# Patient Record
Sex: Male | Born: 1970 | Race: White | Hispanic: No | Marital: Married | Smoking: Current every day smoker
Health system: Southern US, Community
[De-identification: ages and names within clinical notes are randomized; demographics above are authoritative.]

## PROBLEM LIST (undated history)

## (undated) DIAGNOSIS — I10 Essential (primary) hypertension: Secondary | ICD-10-CM

## (undated) DIAGNOSIS — J45909 Unspecified asthma, uncomplicated: Secondary | ICD-10-CM

## (undated) DIAGNOSIS — E119 Type 2 diabetes mellitus without complications: Secondary | ICD-10-CM

## (undated) DIAGNOSIS — F17200 Nicotine dependence, unspecified, uncomplicated: Secondary | ICD-10-CM

## (undated) DIAGNOSIS — N6320 Unspecified lump in the left breast, unspecified quadrant: Secondary | ICD-10-CM

## (undated) DIAGNOSIS — R9431 Abnormal electrocardiogram [ECG] [EKG]: Secondary | ICD-10-CM

## (undated) HISTORY — PX: JOINT REPLACEMENT: SHX530

## (undated) HISTORY — DX: Unspecified asthma, uncomplicated: J45.909

## (undated) HISTORY — DX: Nicotine dependence, unspecified, uncomplicated: F17.200

## (undated) HISTORY — DX: Type 2 diabetes mellitus without complications: E11.9

## (undated) HISTORY — DX: Essential (primary) hypertension: I10

## (undated) HISTORY — DX: Unspecified lump in the left breast, unspecified quadrant: N63.20

## (undated) HISTORY — DX: Abnormal electrocardiogram (ECG) (EKG): R94.31

## (undated) HISTORY — PX: NO PAST SURGERIES: SHX2092

---

## 2010-05-09 ENCOUNTER — Emergency Department (HOSPITAL_COMMUNITY): Admission: EM | Admit: 2010-05-09 | Discharge: 2010-05-10 | Payer: Self-pay | Admitting: Emergency Medicine

## 2010-05-10 ENCOUNTER — Ambulatory Visit: Payer: Self-pay | Admitting: Psychiatry

## 2010-05-10 ENCOUNTER — Inpatient Hospital Stay (HOSPITAL_COMMUNITY): Admission: AD | Admit: 2010-05-10 | Discharge: 2010-05-13 | Payer: Self-pay | Admitting: Psychiatry

## 2011-03-04 LAB — LIPASE, BLOOD: Lipase: 34 U/L (ref 11–59)

## 2011-03-04 LAB — COMPREHENSIVE METABOLIC PANEL
ALT: 115 U/L — ABNORMAL HIGH (ref 0–53)
Albumin: 4.3 g/dL (ref 3.5–5.2)
CO2: 29 mEq/L (ref 19–32)
Glucose, Bld: 116 mg/dL — ABNORMAL HIGH (ref 70–99)
Potassium: 3.8 mEq/L (ref 3.5–5.1)
Total Bilirubin: 0.6 mg/dL (ref 0.3–1.2)

## 2011-03-04 LAB — DIFFERENTIAL
Eosinophils Absolute: 0.1 10*3/uL (ref 0.0–0.7)
Lymphocytes Relative: 47 % — ABNORMAL HIGH (ref 12–46)
Lymphs Abs: 4.9 10*3/uL — ABNORMAL HIGH (ref 0.7–4.0)
Monocytes Absolute: 0.9 10*3/uL (ref 0.1–1.0)
Monocytes Relative: 9 % (ref 3–12)
Neutrophils Relative %: 43 % (ref 43–77)

## 2011-03-04 LAB — CBC
HCT: 46.9 % (ref 39.0–52.0)
MCHC: 35 g/dL (ref 30.0–36.0)
MCV: 93.3 fL (ref 78.0–100.0)
RBC: 5.03 MIL/uL (ref 4.22–5.81)
RDW: 12.5 % (ref 11.5–15.5)
WBC: 10.4 10*3/uL (ref 4.0–10.5)

## 2011-03-04 LAB — URINALYSIS, ROUTINE W REFLEX MICROSCOPIC
Bilirubin Urine: NEGATIVE
Glucose, UA: NEGATIVE mg/dL
Hgb urine dipstick: NEGATIVE
Specific Gravity, Urine: 1.006 (ref 1.005–1.030)
Urobilinogen, UA: 0.2 mg/dL (ref 0.0–1.0)
pH: 6 (ref 5.0–8.0)

## 2011-03-04 LAB — URINE MICROSCOPIC-ADD ON

## 2011-03-04 LAB — HEPATIC FUNCTION PANEL
Albumin: 4.2 g/dL (ref 3.5–5.2)
Alkaline Phosphatase: 89 U/L (ref 39–117)
Bilirubin, Direct: 0.1 mg/dL (ref 0.0–0.3)
Indirect Bilirubin: 1 mg/dL — ABNORMAL HIGH (ref 0.3–0.9)
Total Protein: 7.4 g/dL (ref 6.0–8.3)

## 2011-03-04 LAB — ETHANOL
Alcohol, Ethyl (B): 315 mg/dL — ABNORMAL HIGH (ref 0–10)
Alcohol, Ethyl (B): 97 mg/dL — ABNORMAL HIGH (ref 0–10)

## 2011-03-04 LAB — RAPID URINE DRUG SCREEN, HOSP PERFORMED
Amphetamines: NOT DETECTED
Benzodiazepines: NOT DETECTED

## 2011-03-04 LAB — RPR: RPR Ser Ql: NONREACTIVE

## 2011-05-01 ENCOUNTER — Other Ambulatory Visit: Payer: Self-pay | Admitting: Family Medicine

## 2011-05-01 DIAGNOSIS — N63 Unspecified lump in unspecified breast: Secondary | ICD-10-CM

## 2011-05-10 ENCOUNTER — Ambulatory Visit
Admission: RE | Admit: 2011-05-10 | Discharge: 2011-05-10 | Disposition: A | Discharge status: Home / Self Care | Payer: 59 | Source: Ambulatory Visit | Attending: Family Medicine | Admitting: Family Medicine

## 2011-05-10 DIAGNOSIS — N63 Unspecified lump in unspecified breast: Secondary | ICD-10-CM

## 2011-05-16 ENCOUNTER — Encounter: Payer: Self-pay | Admitting: Cardiology

## 2011-05-17 ENCOUNTER — Encounter: Payer: Self-pay | Admitting: Cardiology

## 2011-05-17 ENCOUNTER — Ambulatory Visit (INDEPENDENT_AMBULATORY_CARE_PROVIDER_SITE_OTHER): Payer: 59 | Admitting: Cardiology

## 2011-05-17 VITALS — BP 141/84 | HR 95 | Resp 14 | Ht 67.0 in | Wt 222.0 lb

## 2011-05-17 DIAGNOSIS — I10 Essential (primary) hypertension: Secondary | ICD-10-CM

## 2011-05-17 DIAGNOSIS — Z720 Tobacco use: Secondary | ICD-10-CM

## 2011-05-17 DIAGNOSIS — E785 Hyperlipidemia, unspecified: Secondary | ICD-10-CM

## 2011-05-17 DIAGNOSIS — F17200 Nicotine dependence, unspecified, uncomplicated: Secondary | ICD-10-CM

## 2011-05-17 DIAGNOSIS — E1169 Type 2 diabetes mellitus with other specified complication: Secondary | ICD-10-CM | POA: Insufficient documentation

## 2011-05-17 DIAGNOSIS — R9431 Abnormal electrocardiogram [ECG] [EKG]: Secondary | ICD-10-CM

## 2011-05-17 MED ORDER — ASPIRIN EC 81 MG PO TBEC: 81.0000 mg | DELAYED_RELEASE_TABLET | Freq: Every day | ORAL | Status: AC

## 2011-05-17 NOTE — Patient Instructions (Addendum)
Your physician has requested that you have a stress echocardiogram. For further information please visit [email or web address] Please follow instruction sheet as given.  Your physician has recommended you make the following change in your medication: Start taking an Aspirin 81 mg daily  Your physician discussed the hazards of tobacco use. Tobacco use cessation is recommended and techniques and options to help you quit were discussed.  Your physician encouraged you to lose weight for better health.  Reducing your salt (sodium) intake can help with keeping your blood pressure under control along with medication.

## 2011-05-17 NOTE — Progress Notes (Signed)
HPI [name] is a 40 year old married white male, father of 3, who is referred today by Dr. Christell Constant for the evaluation of an abnormal EKG.  About 3 weeks ago, a bad coughing spell and passed out. He did not seek medical attention until the next day. At that time he was found to have a blood pressure over 200 systolic and 100 diastolic. EKG showed anterolateral T-wave inversion in borderline LVH.  He was started on antihypertensive therapy. In addition he was started on a statin. I do not have any of his numbers.  He smokes about a pack and half of cigarettes a day. 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 He's overweight. He's never done illicit drugs other than marijuana.  He denies exertional chest tightness or angina. He does have a lot of dyspnea but he smokes quite a bit. Inhalers seem to help. He is not on aspirin.  EKG from Dr. Kathi Der office was reviewed. I confirmed he has anterolateral ST segment changes and borderline LVH. I did not repeat his EKG today. Past Medical History  Diagnosis Date  . HTN (hypertension)   . Smoker   . Left breast mass   . Asthmatic bronchitis   . Abnormal EKG     No past surgical history on file.  No family history on file.  History   Social History  . Marital Status: Married    Spouse Name: N/A    Number of Children: N/A  . Years of Education: N/A   Occupational History  . Not on file.   Social History Main Topics  . Smoking status: Current Everyday Smoker  . Smokeless tobacco: Not on file  . Alcohol Use: Not on file  . Drug Use: Not on file  . Sexually Active: Not on file   Other Topics Concern  . Not on file   Social History Narrative  . No narrative on file    Allergies  Allergen Reactions  . Penicillins     Current Outpatient Prescriptions  Medication Sig Dispense Refill  . albuterol (PROVENTIL) 90 MCG/ACT inhaler Inhale 2 puffs into the lungs every 6 (six) hours as needed.        .  Fluticasone-Salmeterol (ADVAIR DISKUS) 250-50 MCG/DOSE AEPB Inhale 1 puff into the lungs every 12 (twelve) hours.        Marland Kitchen LIPITOR 20 MG tablet Take 20 mg by mouth daily.       . Olmesartan-Amlodipine-HCTZ (TRIBENZOR) 40-10-25 MG TABS Take 1 tablet by mouth daily.        Marland Kitchen aspirin EC 81 MG tablet Take 1 tablet (81 mg total) by mouth daily.  150 tablet  2  . DISCONTD: olmesartan-hydrochlorothiazide (BENICAR HCT) 40-25 MG per tablet Take 1 tablet by mouth daily.          ROS Negative other than HPI.   PE General Appearance: well developed, well nourished in no acute distress, obese, ruddy complexion HEENT: symmetrical face, PERRLA, good dentition  Neck: no JVD, thyromegaly, or adenopathy, trachea midline Chest: symmetric without deformity Cardiac: PMI non-displaced, RRR, normal S1, S2, no gallop or murmur Lung: clear to ausculation and percussion Vascular: all pulses full without bruits  Abdominal: nondistended, nontender, good bowel sounds, no HSM, no bruits Extremities: no cyanosis, clubbing or edema, no sign of DVT, no varicosities  Skin: normal color, no rashes Neuro: alert and oriented x 3, non-focal Pysch: normal affect Filed Vitals:   05/17/11 1033  BP: 141/84  Pulse: 95  Resp: 14  Height: 5\' 7"  (1.702 m)  Weight: 222 lb (100.699 kg)    EKG  Labs and Studies Reviewed.   Lab Results  Component Value Date   WBC 10.4 05/09/2010   HGB 16.4 05/09/2010   HCT 46.9 05/09/2010   MCV 93.3 05/09/2010   PLT 271 05/09/2010      Chemistry      Component Value Date/Time   NA 139 05/09/2010 2216   K 3.8 05/09/2010 2216   CL 101 05/09/2010 2216   CO2 29 05/09/2010 2216   BUN 12 05/09/2010 2216   CREATININE 0.95 05/09/2010 2216      Component Value Date/Time   CALCIUM 8.9 05/09/2010 2216   ALKPHOS 89 05/11/2010 2141   AST 61* 05/11/2010 2141   ALT 95* 05/11/2010 2141   BILITOT 1.1 05/11/2010 2141       No results found for this basename: CHOL   No results found for this  basename: HDL   No results found for this basename: LDLCALC   No results found for this basename: TRIG   No results found for this basename: CHOLHDL   No results found for this basename: HGBA1C   Lab Results  Component Value Date   ALT 95* 05/11/2010   AST 61* 05/11/2010   ALKPHOS 89 05/11/2010   BILITOT 1.1 05/11/2010   No results found for this basename: TSH

## 2011-06-07 ENCOUNTER — Ambulatory Visit (HOSPITAL_COMMUNITY): Payer: PRIVATE HEALTH INSURANCE | Attending: Cardiology | Admitting: Radiology

## 2011-06-07 ENCOUNTER — Ambulatory Visit (HOSPITAL_BASED_OUTPATIENT_CLINIC_OR_DEPARTMENT_OTHER): Payer: PRIVATE HEALTH INSURANCE | Admitting: Radiology

## 2011-06-07 DIAGNOSIS — R9431 Abnormal electrocardiogram [ECG] [EKG]: Secondary | ICD-10-CM

## 2011-06-07 DIAGNOSIS — F17200 Nicotine dependence, unspecified, uncomplicated: Secondary | ICD-10-CM | POA: Insufficient documentation

## 2011-06-07 DIAGNOSIS — R072 Precordial pain: Secondary | ICD-10-CM

## 2011-06-07 DIAGNOSIS — E785 Hyperlipidemia, unspecified: Secondary | ICD-10-CM | POA: Insufficient documentation

## 2011-06-07 DIAGNOSIS — R0989 Other specified symptoms and signs involving the circulatory and respiratory systems: Secondary | ICD-10-CM

## 2013-06-04 ENCOUNTER — Other Ambulatory Visit: Payer: Self-pay

## 2013-06-04 MED ORDER — AMLODIPINE-OLMESARTAN 10-40 MG PO TABS: 1.0000 | ORAL_TABLET | Freq: Every day | ORAL | Status: DC

## 2013-07-29 ENCOUNTER — Telehealth: Payer: Self-pay | Admitting: Family Medicine

## 2013-07-30 MED ORDER — ATORVASTATIN CALCIUM 20 MG PO TABS: 20.0000 mg | ORAL_TABLET | Freq: Every day | ORAL | Status: DC

## 2013-07-30 MED ORDER — HYDROCHLOROTHIAZIDE 25 MG PO TABS: 25.0000 mg | ORAL_TABLET | Freq: Every day | ORAL | Status: DC

## 2013-07-30 MED ORDER — AMLODIPINE-OLMESARTAN 10-40 MG PO TABS: 1.0000 | ORAL_TABLET | Freq: Every day | ORAL | Status: DC

## 2013-07-30 NOTE — Telephone Encounter (Signed)
done

## 2013-08-03 ENCOUNTER — Other Ambulatory Visit: Payer: Self-pay | Admitting: Family Medicine

## 2013-08-03 ENCOUNTER — Telehealth: Payer: Self-pay | Admitting: *Deleted

## 2013-08-03 DIAGNOSIS — I10 Essential (primary) hypertension: Secondary | ICD-10-CM

## 2013-08-03 MED ORDER — LOSARTAN POTASSIUM 100 MG PO TABS: 100.0000 mg | ORAL_TABLET | Freq: Every day | ORAL | Status: DC

## 2013-08-03 MED ORDER — AMLODIPINE BESYLATE 10 MG PO TABS: 10.0000 mg | ORAL_TABLET | Freq: Every day | ORAL | Status: DC

## 2013-08-03 NOTE — Telephone Encounter (Signed)
Stop Azor and start amlodipine and losartan.  Meds sent to pharmacy

## 2013-08-03 NOTE — Telephone Encounter (Signed)
Ins co will not pay for David Bartlett's azor unless he tries amlodipine plus losartan,eprosartan,ibaersartan or benicar, diovan or telmisaetan.  Can yoy change this since I have nothing he has tried in the past.  He has not been seen here since Feb 2014 and has an appt with you in August 29. Thanks

## 2013-08-04 NOTE — Telephone Encounter (Signed)
Left message for pt the following recommendations below and asked to call if questions or problems

## 2013-08-13 ENCOUNTER — Encounter: Payer: Self-pay | Admitting: Family Medicine

## 2013-08-13 ENCOUNTER — Ambulatory Visit (INDEPENDENT_AMBULATORY_CARE_PROVIDER_SITE_OTHER): Payer: 59 | Admitting: Family Medicine

## 2013-08-13 VITALS — BP 131/79 | HR 87 | Temp 97.7°F | Ht 67.0 in | Wt 213.0 lb

## 2013-08-13 DIAGNOSIS — I10 Essential (primary) hypertension: Secondary | ICD-10-CM

## 2013-08-13 DIAGNOSIS — Z0000 Encounter for general adult medical examination without abnormal findings: Secondary | ICD-10-CM

## 2013-08-13 LAB — POCT CBC
Granulocyte percent: 53.7 %G (ref 37–80)
HCT, POC: 41.9 % — AB (ref 43.5–53.7)
Hemoglobin: 14.3 g/dL (ref 14.1–18.1)
Lymph, poc: 3.4 (ref 0.6–3.4)
MCH, POC: 30.9 pg (ref 27–31.2)
MCHC: 34.1 g/dL (ref 31.8–35.4)
MCV: 90.7 fL (ref 80–97)
MPV: 7.3 fL (ref 0–99.8)
POC Granulocyte: 4.5 (ref 2–6.9)
POC LYMPH PERCENT: 40.9 %L (ref 10–50)
Platelet Count, POC: 172 10*3/uL (ref 142–424)
RBC: 4.6 M/uL — AB (ref 4.69–6.13)
RDW, POC: 12 %
WBC: 8.4 10*3/uL (ref 4.6–10.2)

## 2013-08-13 NOTE — Patient Instructions (Addendum)
Smoking Cessation Quitting smoking is important to your health and has many advantages. However, it is not always easy to quit since nicotine is a very addictive drug. Often times, people try 3 times or more before being able to quit. This document explains the best ways for you to prepare to quit smoking. Quitting takes hard work and a lot of effort, but you can do it. ADVANTAGES OF QUITTING SMOKING  You will live longer, feel better, and live better.  Your body will feel the impact of quitting smoking almost immediately.  Within 20 minutes, blood pressure decreases. Your pulse returns to its normal level.  After 8 hours, carbon monoxide levels in the blood return to normal. Your oxygen level increases.  After 24 hours, the chance of having a heart attack starts to decrease. Your breath, hair, and body stop smelling like smoke.  After 48 hours, damaged nerve endings begin to recover. Your sense of taste and smell improve.  After 72 hours, the body is virtually free of nicotine. Your bronchial tubes relax and breathing becomes easier.  After 2 to 12 weeks, lungs can hold more air. Exercise becomes easier and circulation improves.  The risk of having a heart attack, stroke, cancer, or lung disease is greatly reduced.  After 1 year, the risk of coronary heart disease is cut in half.  After 5 years, the risk of stroke falls to the same as a nonsmoker.  After 10 years, the risk of lung cancer is cut in half and the risk of other cancers decreases significantly.  After 15 years, the risk of coronary heart disease drops, usually to the level of a nonsmoker.  If you are pregnant, quitting smoking will improve your chances of having a healthy baby.  The people you live with, especially any children, will be healthier.  You will have extra money to spend on things other than cigarettes. QUESTIONS TO THINK ABOUT BEFORE ATTEMPTING TO QUIT You may want to talk about your answers with your  caregiver.  Why do you want to quit?  If you tried to quit in the past, what helped and what did not?  What will be the most difficult situations for you after you quit? How will you plan to handle them?  Who can help you through the tough times? Your family? Friends? A caregiver?  What pleasures do you get from smoking? What ways can you still get pleasure if you quit? Here are some questions to ask your caregiver:  How can you help me to be successful at quitting?  What medicine do you think would be best for me and how should I take it?  What should I do if I need more help?  What is smoking withdrawal like? How can I get information on withdrawal? GET READY  Set a quit date.  Change your environment by getting rid of all cigarettes, ashtrays, matches, and lighters in your home, car, or work. Do not let people smoke in your home.  Review your past attempts to quit. Think about what worked and what did not. GET SUPPORT AND ENCOURAGEMENT You have a better chance of being successful if you have help. You can get support in many ways.  Tell your family, friends, and co-workers that you are going to quit and need their support. Ask them not to smoke around you.  Get individual, group, or telephone counseling and support. Programs are available at local hospitals and health centers. Call your local health department for   information about programs in your area.  Spiritual beliefs and practices may help some smokers quit.  Download a "quit meter" on your computer to keep track of quit statistics, such as how long you have gone without smoking, cigarettes not smoked, and money saved.  Get a self-help book about quitting smoking and staying off of tobacco. LEARN NEW SKILLS AND BEHAVIORS  Distract yourself from urges to smoke. Talk to someone, go for a walk, or occupy your time with a task.  Change your normal routine. Take a different route to work. Drink tea instead of coffee.  Eat breakfast in a different place.  Reduce your stress. Take a hot bath, exercise, or read a book.  Plan something enjoyable to do every day. Reward yourself for not smoking.  Explore interactive web-based programs that specialize in helping you quit. GET MEDICINE AND USE IT CORRECTLY Medicines can help you stop smoking and decrease the urge to smoke. Combining medicine with the above behavioral methods and support can greatly increase your chances of successfully quitting smoking.  Nicotine replacement therapy helps deliver nicotine to your body without the negative effects and risks of smoking. Nicotine replacement therapy includes nicotine gum, lozenges, inhalers, nasal sprays, and skin patches. Some may be available over-the-counter and others require a prescription.  Antidepressant medicine helps people abstain from smoking, but how this works is unknown. This medicine is available by prescription.  Nicotinic receptor partial agonist medicine simulates the effect of nicotine in your brain. This medicine is available by prescription. Ask your caregiver for advice about which medicines to use and how to use them based on your health history. Your caregiver will tell you what side effects to look out for if you choose to be on a medicine or therapy. Carefully read the information on the package. Do not use any other product containing nicotine while using a nicotine replacement product.  RELAPSE OR DIFFICULT SITUATIONS Most relapses occur within the first 3 months after quitting. Do not be discouraged if you start smoking again. Remember, most people try several times before finally quitting. You may have symptoms of withdrawal because your body is used to nicotine. You may crave cigarettes, be irritable, feel very hungry, cough often, get headaches, or have difficulty concentrating. The withdrawal symptoms are only temporary. They are strongest when you first quit, but they will go away within  10 14 days. To reduce the chances of relapse, try to:  Avoid drinking alcohol. Drinking lowers your chances of successfully quitting.  Reduce the amount of caffeine you consume. Once you quit smoking, the amount of caffeine in your body increases and can give you symptoms, such as a rapid heartbeat, sweating, and anxiety.  Avoid smokers because they can make you want to smoke.  Do not let weight gain distract you. Many smokers will gain weight when they quit, usually less than 10 pounds. Eat a healthy diet and stay active. You can always lose the weight gained after you quit.  Find ways to improve your mood other than smoking. FOR MORE INFORMATION  www.smokefree.gov  Document Released: 11/26/2001 Document Revised: 06/02/2012 Document Reviewed: 03/12/2012 [employer] Patient Information 2014 [employer].  

## 2013-08-13 NOTE — Progress Notes (Signed)
  Subjective:    Patient ID: David Bartlett, male    DOB: 1971-09-13, 42 y.o.   MRN: [id number]  HPI This 42 y.o. male presents for evaluation of annual follow up.  He has  Hx of hypertension.  He has been under a lot of stress recently and  His wife is sick and has had to be hospitalized and he has found out  That his company is going to drop his insurance.   Review of Systems    No chest pain, SOB, HA, dizziness, vision change, N/V, diarrhea, constipation, dysuria, urinary urgency or frequency, myalgias, arthralgias or rash.  Objective:   Physical Exam  Vital signs noted  Well developed well nourished male.  HEENT - Head atraumatic Normocephalic                Eyes - PERRLA, Conjuctiva - clear Sclera- Clear EOMI                Ears - EAC's Wnl TM's Wnl Gross Hearing WNL                Nose - Nares patent                 Throat - oropharanx wnl Respiratory - Lungs with expiratory wheezes. Cardiac - RRR S1 and S2 without murmur GI - Abdomen soft Nontender and bowel sounds active x 4 Extremities - No edema. Neuro - Grossly intact.      Assessment & Plan:  Essential hypertension, benign - Plan: POCT CBC, CMP14+EGFR.  Controlled and continue current regimen.  Routine general medical examination at a health care facility - Plan: POCT CBC, CMP14+EGFR, Lipid panel, Thyroid Panel With TSH, PSA, total and free.  Tobacco abuse - Encouraged patient to quit smoking.

## 2013-08-14 LAB — CMP14+EGFR
ALT: 147 IU/L — ABNORMAL HIGH (ref 0–44)
AST: 127 IU/L — ABNORMAL HIGH (ref 0–40)
Albumin/Globulin Ratio: 1.5 (ref 1.1–2.5)
Albumin: 4.4 g/dL (ref 3.5–5.5)
Alkaline Phosphatase: 105 IU/L (ref 39–117)
BUN/Creatinine Ratio: 14 (ref 9–20)
BUN: 14 mg/dL (ref 6–24)
CO2: 23 mmol/L (ref 18–29)
Calcium: 9.8 mg/dL (ref 8.7–10.2)
Chloride: 95 mmol/L — ABNORMAL LOW (ref 97–108)
Creatinine, Ser: 0.97 mg/dL (ref 0.76–1.27)
GFR calc Af Amer: 110 mL/min/{1.73_m2} (ref >59)
GFR calc non Af Amer: 95 mL/min/{1.73_m2} (ref >59)
Globulin, Total: 3 g/dL (ref 1.5–4.5)
Glucose: 108 mg/dL — ABNORMAL HIGH (ref 65–99)
Potassium: 4.1 mmol/L (ref 3.5–5.2)
Sodium: 136 mmol/L (ref 134–144)
Total Bilirubin: 0.4 mg/dL (ref 0.0–1.2)
Total Protein: 7.4 g/dL (ref 6.0–8.5)

## 2013-08-14 LAB — THYROID PANEL WITH TSH
Free Thyroxine Index: 1.5 (ref 1.2–4.9)
T3 Uptake Ratio: 27 % (ref 24–39)
T4, Total: 5.7 ug/dL (ref 4.5–12.0)
TSH: 1.7 u[IU]/mL (ref 0.450–4.500)

## 2013-08-14 LAB — LIPID PANEL
Chol/HDL Ratio: 5.7 ratio units — ABNORMAL HIGH (ref 0.0–5.0)
Cholesterol, Total: 131 mg/dL (ref 100–199)
HDL: 23 mg/dL — ABNORMAL LOW (ref >39)
Triglycerides: 509 mg/dL — ABNORMAL HIGH (ref 0–149)

## 2013-08-14 LAB — PSA, TOTAL AND FREE
PSA, Free Pct: 65 %
PSA, Free: 0.13 ng/mL
PSA: 0.2 ng/mL (ref 0.0–4.0)

## 2013-08-18 ENCOUNTER — Other Ambulatory Visit: Payer: Self-pay | Admitting: Family Medicine

## 2013-08-18 DIAGNOSIS — E781 Pure hyperglyceridemia: Secondary | ICD-10-CM

## 2013-10-19 ENCOUNTER — Other Ambulatory Visit: Payer: Self-pay

## 2013-10-19 MED ORDER — ALBUTEROL SULFATE HFA 108 (90 BASE) MCG/ACT IN AERS: 2.0000 | INHALATION_SPRAY | Freq: Four times a day (QID) | RESPIRATORY_TRACT | Status: DC | PRN

## 2013-10-19 MED ORDER — HYDROCHLOROTHIAZIDE 25 MG PO TABS: 25.0000 mg | ORAL_TABLET | Freq: Every day | ORAL | Status: DC

## 2014-02-24 ENCOUNTER — Other Ambulatory Visit: Payer: Self-pay | Admitting: Family Medicine

## 2014-02-24 DIAGNOSIS — I10 Essential (primary) hypertension: Secondary | ICD-10-CM

## 2014-02-24 DIAGNOSIS — E785 Hyperlipidemia, unspecified: Secondary | ICD-10-CM

## 2014-02-24 DIAGNOSIS — J45909 Unspecified asthma, uncomplicated: Secondary | ICD-10-CM

## 2014-02-24 MED ORDER — ALBUTEROL SULFATE HFA 108 (90 BASE) MCG/ACT IN AERS: 2.0000 | INHALATION_SPRAY | Freq: Four times a day (QID) | RESPIRATORY_TRACT | Status: DC | PRN

## 2014-02-24 MED ORDER — MONTELUKAST SODIUM 10 MG PO TABS
10.0000 mg | ORAL_TABLET | Freq: Every day | ORAL | Status: DC
Start: 2014-02-24 — End: 2014-10-28

## 2014-02-24 MED ORDER — LOSARTAN POTASSIUM 100 MG PO TABS: 100.0000 mg | ORAL_TABLET | Freq: Every day | ORAL | Status: DC

## 2014-02-24 MED ORDER — AMLODIPINE BESYLATE 10 MG PO TABS: 10.0000 mg | ORAL_TABLET | Freq: Every day | ORAL | Status: DC

## 2014-02-24 MED ORDER — ATORVASTATIN CALCIUM 20 MG PO TABS: 20.0000 mg | ORAL_TABLET | Freq: Every day | ORAL | Status: DC

## 2014-03-03 ENCOUNTER — Other Ambulatory Visit: Payer: Self-pay | Admitting: Family Medicine

## 2014-05-10 ENCOUNTER — Other Ambulatory Visit: Payer: Self-pay | Admitting: Family Medicine

## 2014-09-12 ENCOUNTER — Other Ambulatory Visit: Payer: Self-pay | Admitting: Family Medicine

## 2014-10-28 ENCOUNTER — Ambulatory Visit (INDEPENDENT_AMBULATORY_CARE_PROVIDER_SITE_OTHER): Payer: 59 | Admitting: Family Medicine

## 2014-10-28 ENCOUNTER — Encounter (INDEPENDENT_AMBULATORY_CARE_PROVIDER_SITE_OTHER): Payer: Self-pay

## 2014-10-28 ENCOUNTER — Encounter: Payer: Self-pay | Admitting: Family Medicine

## 2014-10-28 VITALS — BP 137/78 | HR 77 | Temp 97.7°F | Ht 67.0 in | Wt 228.2 lb

## 2014-10-28 DIAGNOSIS — Z9109 Other allergy status, other than to drugs and biological substances: Secondary | ICD-10-CM

## 2014-10-28 DIAGNOSIS — E785 Hyperlipidemia, unspecified: Secondary | ICD-10-CM

## 2014-10-28 DIAGNOSIS — Z91048 Other nonmedicinal substance allergy status: Secondary | ICD-10-CM

## 2014-10-28 DIAGNOSIS — I10 Essential (primary) hypertension: Secondary | ICD-10-CM

## 2014-10-28 DIAGNOSIS — Z139 Encounter for screening, unspecified: Secondary | ICD-10-CM

## 2014-10-28 LAB — POCT CBC
Granulocyte percent: 46.4 %G (ref 37–80)
HCT, POC: 44 % (ref 43.5–53.7)
Hemoglobin: 14 g/dL — AB (ref 14.1–18.1)
Lymph, poc: 4.1 — AB (ref 0.6–3.4)
MCH, POC: 30.7 pg (ref 27–31.2)
MCHC: 33.6 g/dL (ref 31.8–35.4)
MCV: 91.3 fL (ref 80–97)
MPV: 7 fL (ref 0–99.8)
POC Granulocyte: 4.1 (ref 2–6.9)
POC LYMPH PERCENT: 46.7 %L (ref 10–50)
Platelet Count, POC: 19 10*3/uL — AB (ref 142–424)
RBC: 4.8 M/uL (ref 4.69–6.13)
RDW, POC: 12.1 %
WBC: 8.8 10*3/uL (ref 4.6–10.2)

## 2014-10-28 MED ORDER — ALBUTEROL SULFATE HFA 108 (90 BASE) MCG/ACT IN AERS: 2.0000 | INHALATION_SPRAY | Freq: Four times a day (QID) | RESPIRATORY_TRACT | Status: DC | PRN

## 2014-10-28 MED ORDER — MONTELUKAST SODIUM 10 MG PO TABS: 10.0000 mg | ORAL_TABLET | Freq: Every day | ORAL | Status: DC

## 2014-10-28 MED ORDER — AMLODIPINE BESYLATE 10 MG PO TABS: 10.0000 mg | ORAL_TABLET | Freq: Every day | ORAL | Status: DC

## 2014-10-28 MED ORDER — LOSARTAN POTASSIUM 100 MG PO TABS: 100.0000 mg | ORAL_TABLET | Freq: Every day | ORAL | Status: DC

## 2014-10-28 MED ORDER — ATORVASTATIN CALCIUM 20 MG PO TABS: 20.0000 mg | ORAL_TABLET | Freq: Every day | ORAL | Status: DC

## 2014-10-28 NOTE — Progress Notes (Signed)
   Subjective:    Patient ID: David Bartlett, male    DOB: February 01, 1971, 43 y.o.   MRN: [id number]  HPI Patient is here for routine follow up.    Review of Systems  Constitutional: Negative for fever.  HENT: Negative for ear pain.   Eyes: Negative for discharge.  Respiratory: Negative for cough.   Cardiovascular: Negative for chest pain.  Gastrointestinal: Negative for abdominal distention.  Endocrine: Negative for polyuria.  Genitourinary: Negative for difficulty urinating.  Musculoskeletal: Negative for gait problem and neck pain.  Skin: Negative for color change and rash.  Neurological: Negative for speech difficulty and headaches.  Psychiatric/Behavioral: Negative for agitation.       Objective:    BP 137/78 mmHg  Pulse 77  Temp(Src) 97.7 F (36.5 C) (Oral)  Ht $R'5\' 7"'Ua$  (1.702 m)  Wt 228 lb 3.2 oz (103.511 kg)  BMI 35.73 kg/m2 Physical Exam  Constitutional: He is oriented to person, place, and time. He appears well-developed and well-nourished.  HENT:  Head: Normocephalic and atraumatic.  Mouth/Throat: Oropharynx is clear and moist.  Eyes: Pupils are equal, round, and reactive to light.  Neck: Normal range of motion. Neck supple.  Cardiovascular: Normal rate and regular rhythm.   No murmur heard. Pulmonary/Chest: Effort normal and breath sounds normal.  Abdominal: Soft. Bowel sounds are normal. There is no tenderness.  Neurological: He is alert and oriented to person, place, and time.  Skin: Skin is warm and dry.  Psychiatric: He has a normal mood and affect.          Assessment & Plan:     ICD-9-CM ICD-10-CM   1. Screening V82.9 Z13.9 POCT CBC     Thyroid Panel With TSH     PSA, total and free  2. Hyperlipemia 272.4 E78.5 Lipid panel  3. Essential hypertension, benign 401.1 I10 POCT CBC     CMP14+EGFR     losartan (COZAAR) 100 MG tablet     amLODipine (NORVASC) 10 MG tablet  4. Essential hypertension 401.9 I10 atorvastatin (LIPITOR) 20 MG tablet  5.  Environmental allergies V15.09 Z91.048 montelukast (SINGULAIR) 10 MG tablet     albuterol (PROVENTIL HFA;VENTOLIN HFA) 108 (90 BASE) MCG/ACT inhaler     No Follow-up on file.  Lysbeth Penner FNP

## 2014-10-29 LAB — LIPID PANEL
Chol/HDL Ratio: 4.1 ratio units (ref 0.0–5.0)
Cholesterol, Total: 119 mg/dL (ref 100–199)
HDL: 29 mg/dL — ABNORMAL LOW (ref >39)
LDL Calculated: 27 mg/dL (ref 0–99)
Triglycerides: 317 mg/dL — ABNORMAL HIGH (ref 0–149)
VLDL Cholesterol Cal: 63 mg/dL — ABNORMAL HIGH (ref 5–40)

## 2014-10-29 LAB — CMP14+EGFR
ALT: 96 IU/L — ABNORMAL HIGH (ref 0–44)
AST: 56 IU/L — ABNORMAL HIGH (ref 0–40)
Albumin/Globulin Ratio: 1.7 (ref 1.1–2.5)
Albumin: 4.8 g/dL (ref 3.5–5.5)
Alkaline Phosphatase: 88 IU/L (ref 39–117)
BUN/Creatinine Ratio: 13 (ref 9–20)
BUN: 11 mg/dL (ref 6–24)
CO2: 26 mmol/L (ref 18–29)
Calcium: 9.8 mg/dL (ref 8.7–10.2)
Chloride: 95 mmol/L — ABNORMAL LOW (ref 97–108)
Creatinine, Ser: 0.82 mg/dL (ref 0.76–1.27)
GFR calc Af Amer: 124 mL/min/{1.73_m2} (ref >59)
GFR calc non Af Amer: 108 mL/min/{1.73_m2} (ref >59)
Globulin, Total: 2.8 g/dL (ref 1.5–4.5)
Glucose: 119 mg/dL — ABNORMAL HIGH (ref 65–99)
Potassium: 3.8 mmol/L (ref 3.5–5.2)
Sodium: 139 mmol/L (ref 134–144)
Total Bilirubin: 0.5 mg/dL (ref 0.0–1.2)
Total Protein: 7.6 g/dL (ref 6.0–8.5)

## 2014-10-29 LAB — THYROID PANEL WITH TSH
Free Thyroxine Index: 2 (ref 1.2–4.9)
T3 Uptake Ratio: 26 % (ref 24–39)
T4, Total: 7.6 ug/dL (ref 4.5–12.0)
TSH: 1.63 u[IU]/mL (ref 0.450–4.500)

## 2014-10-29 LAB — PSA, TOTAL AND FREE
PSA, Free Pct: 60 %
PSA, Free: 0.12 ng/mL
PSA: 0.2 ng/mL (ref 0.0–4.0)

## 2014-10-31 ENCOUNTER — Other Ambulatory Visit: Payer: Self-pay | Admitting: Family Medicine

## 2014-11-28 ENCOUNTER — Encounter: Payer: Self-pay | Admitting: Cardiology

## 2015-03-10 ENCOUNTER — Other Ambulatory Visit: Payer: Self-pay | Admitting: Family Medicine

## 2015-04-14 ENCOUNTER — Other Ambulatory Visit: Payer: Self-pay | Admitting: Family Medicine

## 2015-05-08 ENCOUNTER — Other Ambulatory Visit: Payer: Self-pay | Admitting: Family Medicine

## 2015-06-06 ENCOUNTER — Other Ambulatory Visit: Payer: Self-pay | Admitting: Family Medicine

## 2015-06-27 ENCOUNTER — Other Ambulatory Visit: Payer: Self-pay | Admitting: Family Medicine

## 2015-07-14 ENCOUNTER — Encounter: Payer: Self-pay | Admitting: Family Medicine

## 2015-07-14 ENCOUNTER — Ambulatory Visit (INDEPENDENT_AMBULATORY_CARE_PROVIDER_SITE_OTHER): Payer: 59 | Admitting: Family Medicine

## 2015-07-14 VITALS — BP 153/90 | HR 79 | Temp 98.3°F | Ht 67.0 in | Wt 230.8 lb

## 2015-07-14 DIAGNOSIS — I10 Essential (primary) hypertension: Secondary | ICD-10-CM | POA: Diagnosis not present

## 2015-07-14 DIAGNOSIS — J438 Other emphysema: Secondary | ICD-10-CM

## 2015-07-14 DIAGNOSIS — E785 Hyperlipidemia, unspecified: Secondary | ICD-10-CM | POA: Diagnosis not present

## 2015-07-14 NOTE — Progress Notes (Signed)
Subjective:  Patient ID: David Bartlett, male    DOB: [birth year]  Age: 44 y.o. MRN: [id number]  CC: Medication Refill   HPI Aleksander Edmiston presents for  follow-up of hypertension. Patient has no history of headache chest pain or shortness of breath or recent cough. Patient also denies symptoms of TIA such as numbness weakness lateralizing. Patient checks  blood pressure at home and has not had any elevated readings recently. Patient denies side effects from his medication. States taking it regularly. Eating salty foods this month.   Patient also  in for follow-up of elevated cholesterol. Doing well without complaints on current medication. Denies side effects of statin including myalgia and arthralgia and nausea. Also in today for liver function testing. Currently no chest pain, shortness of breath or other cardiovascular related symptoms noted.   Working in a dusty environment. Using inhaler BID to prevent wheezing. Smoking as well. Causes wheezing and dyspnea frequently. The inhaler does help.  History Ozil has a past medical history of HTN (hypertension); Smoker; Left breast mass; Asthmatic bronchitis; and Abnormal EKG.   He has no past surgical history on file.   His family history includes Cancer in his father; Heart attack in his father and mother; Kidney disease in his mother.He reports that he has been smoking Cigarettes.  He has a 10 pack-year smoking history. He does not have any smokeless tobacco history on file. He reports that he drinks about 1.8 oz of alcohol per week. He reports that he does not use illicit drugs.  Current Outpatient Prescriptions on File Prior to Visit  Medication Sig Dispense Refill  . albuterol (PROVENTIL HFA;VENTOLIN HFA) 108 (90 BASE) MCG/ACT inhaler Inhale 2 puffs into the lungs every 6 (six) hours as needed for wheezing or shortness of breath. 1 Inhaler 5  . amLODipine (NORVASC) 10 MG tablet Take 1 tablet (10 mg total) by mouth daily. 30 tablet 11  .  atorvastatin (LIPITOR) 20 MG tablet Take 1 tablet (20 mg total) by mouth daily. 30 tablet 11  . hydrochlorothiazide (HYDRODIURIL) 25 MG tablet TAKE ONE (1) TABLET EACH DAY 30 tablet 0  . losartan (COZAAR) 100 MG tablet Take 1 tablet (100 mg total) by mouth daily. 30 tablet 11  . montelukast (SINGULAIR) 10 MG tablet Take 1 tablet (10 mg total) by mouth at bedtime. 30 tablet 11   No current facility-administered medications on file prior to visit.    ROS Review of Systems  Constitutional: Negative for fever, chills and diaphoresis.  HENT: Negative for congestion, rhinorrhea and sore throat.   Respiratory: Positive for cough, chest tightness (intermittent), shortness of breath and wheezing.   Cardiovascular: Negative for chest pain.  Gastrointestinal: Negative for nausea, vomiting, abdominal pain, diarrhea, constipation and abdominal distention.  Genitourinary: Negative for dysuria and frequency.  Musculoskeletal: Negative for joint swelling and arthralgias.  Skin: Negative for rash.  Neurological: Negative for headaches.    Objective:  BP 153/90 mmHg  Pulse 79  Temp(Src) 98.3 F (36.8 C) (Oral)  Ht _0  (1.702 m)  Wt 230 lb 12.8 oz (104.69 kg)  BMI 36.14 kg/m2  BP Readings from Last 3 Encounters:  07/14/15 153/90  10/28/14 137/78  08/13/13 131/79    Wt Readings from Last 3 Encounters:  07/14/15 230 lb 12.8 oz (104.69 kg)  10/28/14 228 lb 3.2 oz (103.511 kg)  08/13/13 213 lb (96.616 kg)     Physical Exam  Constitutional: He is oriented to person, place, and time. He appears well-developed and  well-nourished. No distress.  HENT:  Head: Normocephalic and atraumatic.  Right Ear: External ear normal.  Left Ear: External ear normal.  Nose: Nose normal.  Mouth/Throat: Oropharynx is clear and moist.  Eyes: Conjunctivae and EOM are normal. Pupils are equal, round, and reactive to light.  Neck: Normal range of motion. Neck supple. No thyromegaly present.  Cardiovascular:  Normal rate, regular rhythm and normal heart sounds.   No murmur heard. Pulmonary/Chest: Effort normal and breath sounds normal. No respiratory distress. He has no wheezes. He has no rales.  Abdominal: Soft. Bowel sounds are normal. He exhibits no distension. There is no tenderness.  Lymphadenopathy:    He has no cervical adenopathy.  Neurological: He is alert and oriented to person, place, and time. He has normal reflexes.  Skin: Skin is warm and dry.  Psychiatric: He has a normal mood and affect. His behavior is normal. Judgment and thought content normal.    No results found for: HGBA1C  Lab Results  Component Value Date   WBC 9.6 07/15/2015   HGB 14* 10/28/2014   HCT 45.8 07/15/2015   PLT 271 05/09/2010   GLUCOSE 120* 07/15/2015   CHOL 113 07/15/2015   TRIG 282* 07/15/2015   HDL 30* 07/15/2015   LDLCALC 27 07/15/2015   ALT 77* 07/15/2015   AST 48* 07/15/2015   NA 139 07/15/2015   K 4.1 07/15/2015   CL 97 07/15/2015   CREATININE 0.89 07/15/2015   BUN 11 07/15/2015   CO2 22 07/15/2015   TSH 1.630 10/28/2014   PSA 0.3 07/15/2015    Mm Digital Diagnostic Unilat L  05/10/2011   *RADIOLOGY REPORT*  Clinical Data:  44 year old male with left breast swelling and tenderness.  DIGITAL DIAGNOSTIC LEFT MAMMOGRAM WITH CAD  Comparison:  None.  Findings:  There is flame-shaped fibroglandular density behind the left breast consistent with gynecomastia.  There is no mass, distortion or calcification to suggest malignancy. Mammographic images were processed with CAD.  IMPRESSION: Gynecomastia.  No follow-up needed.  BI-RADS CATEGORY 2:  Benign finding(s).  Original Report Authenticated By: Chaney Born, M.D.   Assessment & Plan:   Emmitt was seen today for medication refill.  Diagnoses and all orders for this visit:  Hyperlipemia Orders: -     Cancel: Lipid panel -     Lipid panel; Future  Essential hypertension, benign Orders: -     Cancel: POCT CBC -     Cancel:  CMP14+EGFR -     Cancel: PSA, total and free -     POCT CBC; Future -     CMP14+EGFR; Future -     PSA, total and free; Future  Other emphysema Orders: -     PR BREATHING CAPACITY TEST   I am having Mr. Strickland maintain his losartan, amLODipine, atorvastatin, montelukast, albuterol, and hydrochlorothiazide.  No orders of the defined types were placed in this encounter.    -Eating plan reviewed. Handout given. Purchasing a home monitor for surveillance at home recommendedFollow-up: Return in about 6 months (around 01/14/2016).  Claretta Fraise, M.D.

## 2015-07-14 NOTE — Patient Instructions (Addendum)
Check BP regularly. Omron is preferred monitor brand.  Goal BP is <130/80   DASH Eating Plan DASH stands for "Dietary Approaches to Stop Hypertension." The DASH eating plan is a healthy eating plan that has been shown to reduce high blood pressure (hypertension). Additional health benefits may include reducing the risk of type 2 diabetes mellitus, heart disease, and stroke. The DASH eating plan may also help with weight loss. WHAT DO I NEED TO KNOW ABOUT THE DASH EATING PLAN? For the DASH eating plan, you will follow these general guidelines:  Choose foods with a percent daily value for sodium of less than 5% (as listed on the food label).  Use salt-free seasonings or herbs instead of table salt or sea salt.  Check with your health care provider or pharmacist before using salt substitutes.  Eat lower-sodium products, often labeled as "lower sodium" or "no salt added."  Eat fresh foods.  Eat more vegetables, fruits, and low-fat dairy products.  Choose whole grains. Look for the word "whole" as the first word in the ingredient list.  Choose fish and skinless chicken or [place] more often than red meat. Limit fish, poultry, and meat to 6 oz (170 g) each day.  Limit sweets, desserts, sugars, and sugary drinks.  Choose heart-healthy fats.  Limit cheese to 1 oz (28 g) per day.  Eat more home-cooked food and less restaurant, buffet, and fast food.  Limit fried foods.  Cook foods using methods other than frying.  Limit canned vegetables. If you do use them, rinse them well to decrease the sodium.  When eating at a restaurant, ask that your food be prepared with less salt, or no salt if possible. WHAT FOODS CAN I EAT? Seek help from a dietitian for individual calorie needs. Grains Whole grain or whole wheat bread. Brown rice. Whole grain or whole wheat pasta. Quinoa, bulgur, and whole grain cereals. Low-sodium cereals. Corn or whole wheat flour tortillas. Whole grain cornbread. Whole  grain crackers. Low-sodium crackers. Vegetables Fresh or frozen vegetables (raw, steamed, roasted, or grilled). Low-sodium or reduced-sodium tomato and vegetable juices. Low-sodium or reduced-sodium tomato sauce and paste. Low-sodium or reduced-sodium canned vegetables.  Fruits All fresh, canned (in natural juice), or frozen fruits. Meat and Other Protein Products Ground beef (85% or leaner), grass-fed beef, or beef trimmed of fat. Skinless chicken or [place]. Ground chicken or [place]. Pork trimmed of fat. All fish and seafood. Eggs. Dried beans, peas, or lentils. Unsalted nuts and seeds. Unsalted canned beans. Dairy Low-fat dairy products, such as skim or 1% milk, 2% or reduced-fat cheeses, low-fat ricotta or cottage cheese, or plain low-fat yogurt. Low-sodium or reduced-sodium cheeses. Fats and Oils Tub margarines without trans fats. Light or reduced-fat mayonnaise and salad dressings (reduced sodium). Avocado. Safflower, olive, or canola oils. Natural peanut or almond butter. Other Unsalted popcorn and pretzels. The items listed above may not be a complete list of recommended foods or beverages. Contact your dietitian for more options. WHAT FOODS ARE NOT RECOMMENDED? Grains White bread. White pasta. White rice. Refined cornbread. Bagels and croissants. Crackers that contain trans fat. Vegetables Creamed or fried vegetables. Vegetables in a cheese sauce. Regular canned vegetables. Regular canned tomato sauce and paste. Regular tomato and vegetable juices. Fruits Dried fruits. Canned fruit in light or heavy syrup. Fruit juice. Meat and Other Protein Products Fatty cuts of meat. Ribs, chicken wings, bacon, sausage, bologna, salami, chitterlings, fatback, hot dogs, bratwurst, and packaged luncheon meats. Salted nuts and seeds. Canned beans with salt.  Dairy Whole or 2% milk, cream, half-and-half, and cream cheese. Whole-fat or sweetened yogurt. Full-fat cheeses or blue cheese. Nondairy creamers  and whipped toppings. Processed cheese, cheese spreads, or cheese curds. Condiments Onion and garlic salt, seasoned salt, table salt, and sea salt. Canned and packaged gravies. Worcestershire sauce. Tartar sauce. Barbecue sauce. Teriyaki sauce. Soy sauce, including reduced sodium. Steak sauce. Fish sauce. Oyster sauce. Cocktail sauce. Horseradish. Ketchup and mustard. Meat flavorings and tenderizers. Bouillon cubes. Hot sauce. Tabasco sauce. Marinades. Taco seasonings. Relishes. Fats and Oils Butter, stick margarine, lard, shortening, ghee, and bacon fat. Coconut, palm kernel, or palm oils. Regular salad dressings. Other Pickles and olives. Salted popcorn and pretzels. The items listed above may not be a complete list of foods and beverages to avoid. Contact your dietitian for more information. WHERE CAN I FIND MORE INFORMATION? National Heart, Lung, and [employer] Institute: travelstabloid.com Document Released: 11/21/2011 Document Revised: 04/18/2014 Document Reviewed: 10/06/2013 [facility] Patient Information 2015 [place], [place]. This information is not intended to replace advice given to you by your health care provider. Make sure you discuss any questions you have with your health care provider. Calorie Counting for Weight Loss Calories are energy you get from the things you eat and drink. Your body uses this energy to keep you going throughout the day. The number of calories you eat affects your weight. When you eat more calories than your body needs, your body stores the extra calories as fat. When you eat fewer calories than your body needs, your body burns fat to get the energy it needs. Calorie counting means keeping track of how many calories you eat and drink each day. If you make sure to eat fewer calories than your body needs, you should lose weight. In order for calorie counting to work, you will need to eat the number of calories that are right for you in a  day to lose a healthy amount of weight per week. A healthy amount of weight to lose per week is usually 1-2 lb (0.5-0.9 kg). A dietitian can determine how many calories you need in a day and give you suggestions on how to reach your calorie goal.  WHAT IS MY MY PLAN? My goal is to have __________ calories per day.  If I have this many calories per day, I should lose around __________ pounds per week. WHAT DO I NEED TO KNOW ABOUT CALORIE COUNTING? In order to meet your daily calorie goal, you will need to:  Find out how many calories are in each food you would like to eat. Try to do this before you eat.  Decide how much of the food you can eat.  Write down what you ate and how many calories it had. Doing this is called keeping a food log. WHERE DO I FIND CALORIE INFORMATION? The number of calories in a food can be found on a Nutrition Facts label. Note that all the information on a label is based on a specific serving of the food. If a food does not have a Nutrition Facts label, try to look up the calories online or ask your dietitian for help. HOW DO I DECIDE HOW MUCH TO EAT? To decide how much of the food you can eat, you will need to consider both the number of calories in one serving and the size of one serving. This information can be found on the Nutrition Facts label. If a food does not have a Nutrition Facts label, look up the information online  or ask your dietitian for help. Remember that calories are listed per serving. If you choose to have more than one serving of a food, you will have to multiply the calories per serving by the amount of servings you plan to eat. For example, the label on a package of bread might say that a serving size is 1 slice and that there are 90 calories in a serving. If you eat 1 slice, you will have eaten 90 calories. If you eat 2 slices, you will have eaten 180 calories. HOW DO I KEEP A FOOD LOG? After each meal, record the following information in your food  log:  What you ate.  How much of it you ate.  How many calories it had.  Then, add up your calories. Keep your food log near you, such as in a small notebook in your pocket. Another option is to use a mobile app or website. Some programs will calculate calories for you and show you how many calories you have left each time you add an item to the log. WHAT ARE SOME CALORIE COUNTING TIPS?  Use your calories on foods and drinks that will fill you up and not leave you hungry. Some examples of this include foods like nuts and nut butters, vegetables, lean proteins, and high-fiber foods (more than 5 g fiber per serving).  Eat nutritious foods and avoid empty calories. Empty calories are calories you get from foods or beverages that do not have many nutrients, such as candy and soda. It is better to have a nutritious high-calorie food (such as an avocado) than a food with few nutrients (such as a bag of chips).  Know how many calories are in the foods you eat most often. This way, you do not have to look up how many calories they have each time you eat them.  Look out for foods that may seem like low-calorie foods but are really high-calorie foods, such as baked goods, soda, and fat-free candy.  Pay attention to calories in drinks. Drinks such as sodas, specialty coffee drinks, alcohol, and juices have a lot of calories yet do not fill you up. Choose low-calorie drinks like water and diet drinks.  Focus your calorie counting efforts on higher calorie items. Logging the calories in a garden salad that contains only vegetables is less important than calculating the calories in a milk shake.  Find a way of tracking calories that works for you. Get creative. Most people who are successful find ways to keep track of how much they eat in a day, even if they do not count every calorie. WHAT ARE SOME PORTION CONTROL TIPS?  Know how many calories are in a serving. This will help you know how many servings  of a certain food you can have.  Use a measuring cup to measure serving sizes. This is helpful when you start out. With time, you will be able to estimate serving sizes for some foods.  Take some time to put servings of different foods on your favorite plates, bowls, and cups so you know what a serving looks like.  Try not to eat straight from a bag or box. Doing this can lead to overeating. Put the amount you would like to eat in a cup or on a plate to make sure you are eating the right portion.  Use smaller plates, glasses, and bowls to prevent overeating. This is a quick and easy way to practice portion control. If your plate is  smaller, less food can fit on it.  Try not to multitask while eating, such as watching TV or using your computer. If it is time to eat, sit down at a table and enjoy your food. Doing this will help you to start recognizing when you are full. It will also make you more aware of what and how much you are eating. HOW CAN I CALORIE COUNT WHEN EATING OUT?  Ask for smaller portion sizes or child-sized portions.  Consider sharing an entree and sides instead of getting your own entree.  If you get your own entree, eat only half. Ask for a box at the beginning of your meal and put the rest of your entree in it so you are not tempted to eat it.  Look for the calories on the menu. If calories are listed, choose the lower calorie options.  Choose dishes that include vegetables, fruits, whole grains, low-fat dairy products, and lean protein. Focusing on smart food choices from each of the 5 food groups can help you stay on track at restaurants.  Choose items that are boiled, broiled, grilled, or steamed.  Choose water, milk, unsweetened iced tea, or other drinks without added sugars. If you want an alcoholic beverage, choose a lower calorie option. For example, a regular margarita can have up to 700 calories and a glass of wine has around 150.  Stay away from items that are  buttered, battered, fried, or served with cream sauce. Items labeled "crispy" are usually fried, unless stated otherwise.  Ask for dressings, sauces, and syrups on the side. These are usually very high in calories, so do not eat much of them.  Watch out for salads. Many people think salads are a healthy option, but this is often not the case. Many salads come with bacon, fried chicken, lots of cheese, fried chips, and dressing. All of these items have a lot of calories. If you want a salad, choose a garden salad and ask for grilled meats or steak. Ask for the dressing on the side, or ask for olive oil and vinegar or lemon to use as dressing.  Estimate how many servings of a food you are given. For example, a serving of cooked rice is  cup or about the size of half a tennis ball or one cupcake wrapper. Knowing serving sizes will help you be aware of how much food you are eating at restaurants. The list below tells you how big or small some common portion sizes are based on everyday objects.  1 oz--4 stacked dice.  3 oz--1 deck of cards.  1 tsp--1 dice.  1 Tbsp-- a Ping-Pong ball.  2 Tbsp--1 Ping-Pong ball.   cup--1 tennis ball or 1 cupcake wrapper.  1 cup--1 baseball. Document Released: 12/02/2005 Document Revised: 04/18/2014 Document Reviewed: 10/07/2013 [facility] Patient Information 2015 [place], [place]. This information is not intended to replace advice given to you by your health care provider. Make sure you discuss any questions you have with your health care provider.

## 2015-07-15 ENCOUNTER — Ambulatory Visit (INDEPENDENT_AMBULATORY_CARE_PROVIDER_SITE_OTHER): Payer: 59 | Admitting: *Deleted

## 2015-07-15 DIAGNOSIS — E785 Hyperlipidemia, unspecified: Secondary | ICD-10-CM

## 2015-07-15 DIAGNOSIS — I10 Essential (primary) hypertension: Secondary | ICD-10-CM

## 2015-07-17 LAB — PSA, TOTAL AND FREE
PROSTATE SPECIFIC AG, SERUM: 0.3 ng/mL (ref 0.0–4.0)
PSA, Free Pct: 43.3 %
PSA, Free: 0.13 ng/mL

## 2015-07-17 LAB — CMP14+EGFR
ALBUMIN: 4.5 g/dL (ref 3.5–5.5)
ALT: 77 IU/L — AB (ref 0–44)
AST: 48 IU/L — ABNORMAL HIGH (ref 0–40)
Albumin/Globulin Ratio: 1.7 (ref 1.1–2.5)
Alkaline Phosphatase: 88 IU/L (ref 39–117)
BILIRUBIN TOTAL: 0.3 mg/dL (ref 0.0–1.2)
BUN/Creatinine Ratio: 12 (ref 9–20)
BUN: 11 mg/dL (ref 6–24)
CALCIUM: 9.7 mg/dL (ref 8.7–10.2)
CHLORIDE: 97 mmol/L (ref 97–108)
CO2: 22 mmol/L (ref 18–29)
CREATININE: 0.89 mg/dL (ref 0.76–1.27)
GFR calc Af Amer: 119 mL/min/{1.73_m2} (ref >59)
GFR calc non Af Amer: 103 mL/min/{1.73_m2} (ref >59)
GLOBULIN, TOTAL: 2.7 g/dL (ref 1.5–4.5)
GLUCOSE: 120 mg/dL — AB (ref 65–99)
POTASSIUM: 4.1 mmol/L (ref 3.5–5.2)
Sodium: 139 mmol/L (ref 134–144)
TOTAL PROTEIN: 7.2 g/dL (ref 6.0–8.5)

## 2015-07-17 LAB — CBC WITH DIFFERENTIAL/PLATELET
Basophils Absolute: 0.1 10*3/uL (ref 0.0–0.2)
Basos: 1 %
EOS (ABSOLUTE): 0.2 10*3/uL (ref 0.0–0.4)
Eos: 2 %
Hematocrit: 45.8 % (ref 37.5–51.0)
Hemoglobin: 15 g/dL (ref 12.6–17.7)
IMMATURE GRANULOCYTES: 0 %
Immature Grans (Abs): 0 10*3/uL (ref 0.0–0.1)
Lymphocytes Absolute: 4.3 10*3/uL — ABNORMAL HIGH (ref 0.7–3.1)
Lymphs: 44 %
MCH: 30.9 pg (ref 26.6–33.0)
MCHC: 32.8 g/dL (ref 31.5–35.7)
MCV: 94 fL (ref 79–97)
Monocytes Absolute: 0.9 10*3/uL (ref 0.1–0.9)
Monocytes: 9 %
NEUTROS PCT: 44 %
Neutrophils Absolute: 4.2 10*3/uL (ref 1.4–7.0)
PLATELETS: 216 10*3/uL (ref 150–379)
RBC: 4.85 x10E6/uL (ref 4.14–5.80)
RDW: 12.9 % (ref 12.3–15.4)
WBC: 9.6 10*3/uL (ref 3.4–10.8)

## 2015-07-17 LAB — LIPID PANEL
Chol/HDL Ratio: 3.8 ratio units (ref 0.0–5.0)
Cholesterol, Total: 113 mg/dL (ref 100–199)
HDL: 30 mg/dL — ABNORMAL LOW (ref >39)
LDL CALC: 27 mg/dL (ref 0–99)
Triglycerides: 282 mg/dL — ABNORMAL HIGH (ref 0–149)
VLDL Cholesterol Cal: 56 mg/dL — ABNORMAL HIGH (ref 5–40)

## 2015-07-19 LAB — HGB A1C W/O EAG: HEMOGLOBIN A1C: 5.9 % — AB (ref 4.8–5.6)

## 2015-07-19 LAB — SPECIMEN STATUS REPORT

## 2015-07-24 ENCOUNTER — Other Ambulatory Visit: Payer: Self-pay | Admitting: Family Medicine

## 2015-09-26 ENCOUNTER — Other Ambulatory Visit: Payer: Self-pay | Admitting: *Deleted

## 2015-09-26 DIAGNOSIS — Z9109 Other allergy status, other than to drugs and biological substances: Secondary | ICD-10-CM

## 2015-09-26 MED ORDER — ALBUTEROL SULFATE HFA 108 (90 BASE) MCG/ACT IN AERS: 2.0000 | INHALATION_SPRAY | Freq: Four times a day (QID) | RESPIRATORY_TRACT | Status: DC | PRN

## 2015-10-10 ENCOUNTER — Ambulatory Visit (INDEPENDENT_AMBULATORY_CARE_PROVIDER_SITE_OTHER): Payer: 59 | Admitting: *Deleted

## 2015-10-10 VITALS — BP 145/95 | HR 80

## 2015-10-10 DIAGNOSIS — I10 Essential (primary) hypertension: Secondary | ICD-10-CM

## 2015-10-10 NOTE — Progress Notes (Signed)
Pt here for BP check

## 2015-10-31 ENCOUNTER — Other Ambulatory Visit: Payer: Self-pay

## 2015-10-31 ENCOUNTER — Other Ambulatory Visit: Payer: Self-pay | Admitting: *Deleted

## 2015-10-31 DIAGNOSIS — I10 Essential (primary) hypertension: Secondary | ICD-10-CM

## 2015-10-31 MED ORDER — LOSARTAN POTASSIUM 100 MG PO TABS: 100.0000 mg | ORAL_TABLET | Freq: Every day | ORAL | Status: DC

## 2015-10-31 MED ORDER — AMLODIPINE BESYLATE 10 MG PO TABS: 10.0000 mg | ORAL_TABLET | Freq: Every day | ORAL | Status: DC

## 2015-10-31 MED ORDER — ATORVASTATIN CALCIUM 20 MG PO TABS: 20.0000 mg | ORAL_TABLET | Freq: Every day | ORAL | Status: DC

## 2015-12-13 ENCOUNTER — Other Ambulatory Visit: Payer: Self-pay | Admitting: Family Medicine

## 2015-12-25 ENCOUNTER — Other Ambulatory Visit: Payer: Self-pay | Admitting: Family Medicine

## 2015-12-26 NOTE — Telephone Encounter (Signed)
Okay for 1 month each. Needs appt.

## 2015-12-26 NOTE — Telephone Encounter (Signed)
Pt notified he NTBS for further refills Verbalizes understanding

## 2015-12-26 NOTE — Telephone Encounter (Signed)
Last seen 07/14/15 Dr Livia Snellen  Last lipid 07/15/15

## 2015-12-29 ENCOUNTER — Encounter: Payer: Self-pay | Admitting: Family Medicine

## 2015-12-29 ENCOUNTER — Ambulatory Visit (INDEPENDENT_AMBULATORY_CARE_PROVIDER_SITE_OTHER): Payer: BLUE CROSS/BLUE SHIELD | Admitting: Family Medicine

## 2015-12-29 VITALS — BP 143/87 | HR 88 | Temp 97.5°F | Ht 67.0 in | Wt 238.0 lb

## 2015-12-29 DIAGNOSIS — E785 Hyperlipidemia, unspecified: Secondary | ICD-10-CM

## 2015-12-29 DIAGNOSIS — R6882 Decreased libido: Secondary | ICD-10-CM | POA: Diagnosis not present

## 2015-12-29 DIAGNOSIS — N528 Other male erectile dysfunction: Secondary | ICD-10-CM

## 2015-12-29 DIAGNOSIS — I10 Essential (primary) hypertension: Secondary | ICD-10-CM

## 2015-12-29 DIAGNOSIS — N529 Male erectile dysfunction, unspecified: Secondary | ICD-10-CM | POA: Insufficient documentation

## 2015-12-29 DIAGNOSIS — Z91048 Other nonmedicinal substance allergy status: Secondary | ICD-10-CM | POA: Diagnosis not present

## 2015-12-29 DIAGNOSIS — Z9109 Other allergy status, other than to drugs and biological substances: Secondary | ICD-10-CM

## 2015-12-29 MED ORDER — HYDROCHLOROTHIAZIDE 25 MG PO TABS: 25.0000 mg | ORAL_TABLET | Freq: Every day | ORAL | Status: DC

## 2015-12-29 MED ORDER — MONTELUKAST SODIUM 10 MG PO TABS: 10.0000 mg | ORAL_TABLET | Freq: Every day | ORAL | Status: DC

## 2015-12-29 MED ORDER — ATORVASTATIN CALCIUM 20 MG PO TABS: ORAL_TABLET | ORAL | Status: DC

## 2015-12-29 MED ORDER — LOSARTAN POTASSIUM 100 MG PO TABS: 100.0000 mg | ORAL_TABLET | Freq: Every day | ORAL | Status: DC

## 2015-12-29 MED ORDER — AMLODIPINE BESYLATE 10 MG PO TABS: ORAL_TABLET | ORAL | Status: DC

## 2015-12-29 NOTE — Progress Notes (Signed)
Subjective:  Patient ID: David Bartlett, male    DOB: Apr 17, 1971  Age: 45 y.o. MRN: [id number]  CC: Hypertension   HPI David Bartlett presents for  follow-up of hypertension. Patient has no history of headache chest pain or shortness of breath or recent cough. Patient also denies symptoms of TIA such as numbness weakness lateralizing. Patient checks  blood pressure at home and has not had any elevated readings recently. Patient denies side effects from his medication. States taking it regularly.  Patient in for follow-up of elevated cholesterol. Doing well without complaints on current medication. Denies side effects of statin including myalgia and arthralgia and nausea. Also in today for liver function testing. Currently no chest pain, shortness of breath or other cardiovascular related symptoms noted.  Patient reports that for the last week and a half he has had no interest in sex this is very unusual for him. Even with interest in sex he has been unable to have an erection. He is concerned that this may be a health problem. However, he mentions that he is overwhelmed by stress source. His one daughter is requiring a lot of his assistance. The other one has him worried and he just bought a truck for his 85 year old son, stretching him financially.   History David Bartlett has a past medical history of HTN (hypertension); Smoker; Left breast mass; Asthmatic bronchitis; and Abnormal EKG.   He has no past surgical history on file.   His family history includes Cancer in his father; Heart attack in his father and mother; Kidney disease in his mother.He reports that he has been smoking Cigarettes.  He has a 10 pack-year smoking history. He does not have any smokeless tobacco history on file. He reports that he drinks about 1.8 oz of alcohol per week. He reports that he does not use illicit drugs.  Current Outpatient Prescriptions on File Prior to Visit  Medication Sig Dispense Refill  . albuterol  (PROVENTIL HFA;VENTOLIN HFA) 108 (90 BASE) MCG/ACT inhaler Inhale 2 puffs into the lungs every 6 (six) hours as needed for wheezing or shortness of breath. 1 Inhaler 1   No current facility-administered medications on file prior to visit.    ROS Review of Systems  Constitutional: Negative for fever, chills, diaphoresis and unexpected weight change.  HENT: Negative for congestion, hearing loss, rhinorrhea and sore throat.   Eyes: Negative for visual disturbance.  Respiratory: Negative for cough and shortness of breath.   Cardiovascular: Negative for chest pain.  Gastrointestinal: Negative for abdominal pain, diarrhea and constipation.  Genitourinary: Negative for dysuria and flank pain.  Musculoskeletal: Negative for joint swelling and arthralgias.  Skin: Negative for rash.  Neurological: Negative for dizziness and headaches.  Psychiatric/Behavioral: Negative for sleep disturbance and dysphoric mood.    Objective:  BP 143/87 mmHg  Pulse 88  Temp(Src) 97.5 F (36.4 C) (Oral)  Ht _0  (1.702 m)  Wt 238 lb (107.956 kg)  BMI 37.27 kg/m2  SpO2 97%  BP Readings from Last 3 Encounters:  12/29/15 143/87  10/10/15 145/95  07/14/15 153/90    Wt Readings from Last 3 Encounters:  12/29/15 238 lb (107.956 kg)  07/14/15 230 lb 12.8 oz (104.69 kg)  10/28/14 228 lb 3.2 oz (103.511 kg)     Physical Exam  Constitutional: He appears well-developed and well-nourished.  HENT:  Head: Normocephalic and atraumatic.  Right Ear: Tympanic membrane and external ear normal. No decreased hearing is noted.  Left Ear: Tympanic membrane and external ear normal. No  decreased hearing is noted.  Mouth/Throat: No oropharyngeal exudate or posterior oropharyngeal erythema.  Eyes: Pupils are equal, round, and reactive to light.  Neck: Normal range of motion. Neck supple.  Cardiovascular: Normal rate and regular rhythm.   No murmur heard. Pulmonary/Chest: Breath sounds normal. No respiratory distress.    Abdominal: Soft. Bowel sounds are normal. He exhibits no mass. There is no tenderness.  Vitals reviewed.   Lab Results  Component Value Date   HGBA1C 5.9* 07/15/2015    Lab Results  Component Value Date   WBC WILL FOLLOW 12/30/2015   HGB 14* 10/28/2014   HCT WILL FOLLOW 12/30/2015   PLT WILL FOLLOW 12/30/2015   GLUCOSE 129* 12/30/2015   CHOL 139 12/30/2015   TRIG 652* 12/30/2015   HDL 23* 12/30/2015   LDLCALC Comment 12/30/2015   ALT 64* 12/30/2015   AST 38 12/30/2015   NA 139 12/30/2015   K 4.5 12/30/2015   CL 98 12/30/2015   CREATININE 0.88 12/30/2015   BUN 14 12/30/2015   CO2 24 12/30/2015   TSH 1.410 12/30/2015   PSA 0.2 10/28/2014   HGBA1C 5.9* 07/15/2015    Mm Digital Diagnostic Unilat L  05/10/2011  *RADIOLOGY REPORT* Clinical Data:  45 year old male with left breast swelling and tenderness. DIGITAL DIAGNOSTIC LEFT MAMMOGRAM WITH CAD Comparison:  None. Findings:  There is flame-shaped fibroglandular density behind the left breast consistent with gynecomastia.  There is no mass, distortion or calcification to suggest malignancy. Mammographic images were processed with CAD. IMPRESSION: Gynecomastia.  No follow-up needed. BI-RADS CATEGORY 2:  Benign finding(s). Original Report Authenticated By: Chaney Born, M.D.   Assessment & Plan:   David Bartlett was seen today for hypertension.  Diagnoses and all orders for this visit:  Hyperlipidemia -     TSH -     Lipid panel -     CBC with Differential/Platelet -     CMP14+EGFR  Essential hypertension -     TSH -     CBC with Differential/Platelet -     CMP14+EGFR  Other male erectile dysfunction -     Testosterone,Free and Total -     TSH -     CBC with Differential/Platelet -     CMP14+EGFR  Loss of libido -     Testosterone,Free and Total -     TSH -     CBC with Differential/Platelet -     CMP14+EGFR  Morbid obesity due to excess calories (HCC) -     Testosterone,Free and Total -     TSH -     CBC  with Differential/Platelet -     CMP14+EGFR  Environmental allergies -     montelukast (SINGULAIR) 10 MG tablet; Take 1 tablet (10 mg total) by mouth at bedtime. -     CBC with Differential/Platelet -     CMP14+EGFR  Other orders -     amLODipine (NORVASC) 10 MG tablet; TAKE ONE (1) TABLET EACH DAY -     atorvastatin (LIPITOR) 20 MG tablet; TAKE ONE (1) TABLET EACH DAY -     hydrochlorothiazide (HYDRODIURIL) 25 MG tablet; Take 1 tablet (25 mg total) by mouth daily. -     losartan (COZAAR) 100 MG tablet; Take 1 tablet (100 mg total) by mouth daily.   I have changed Mr. Rabbani hydrochlorothiazide and losartan. I am also having him maintain his albuterol, amLODipine, atorvastatin, and montelukast.  Meds ordered this encounter  Medications  . amLODipine (NORVASC) 10 MG tablet  Sig: TAKE ONE (1) TABLET EACH DAY    Dispense:  90 tablet    Refill:  1  . atorvastatin (LIPITOR) 20 MG tablet    Sig: TAKE ONE (1) TABLET EACH DAY    Dispense:  90 tablet    Refill:  1  . hydrochlorothiazide (HYDRODIURIL) 25 MG tablet    Sig: Take 1 tablet (25 mg total) by mouth daily.    Dispense:  90 tablet    Refill:  1  . losartan (COZAAR) 100 MG tablet    Sig: Take 1 tablet (100 mg total) by mouth daily.    Dispense:  90 tablet    Refill:  1  . montelukast (SINGULAIR) 10 MG tablet    Sig: Take 1 tablet (10 mg total) by mouth at bedtime.    Dispense:  90 tablet    Refill:  1   Reviewed stress mgmt. Handout given.  Follow-up: Return in about 3 months (around 03/28/2016).  Claretta Fraise, M.D.

## 2015-12-29 NOTE — Patient Instructions (Signed)
Stress and Stress Management Stress is a normal reaction to life events. It is what you feel when life demands more than you are used to or more than you can handle. Some stress can be useful. For example, the stress reaction can help you catch the last bus of the day, study for a test, or meet a deadline at work. But stress that occurs too often or for too long can cause problems. It can affect your emotional health and interfere with relationships and normal daily activities. Too much stress can weaken your immune system and increase your risk for physical illness. If you already have a medical problem, stress can make it worse. CAUSES  All sorts of life events may cause stress. An event that causes stress for one person may not be stressful for another person. Major life events commonly cause stress. These may be positive or negative. Examples include losing your job, moving into a new home, getting married, having a baby, or losing a loved one. Less obvious life events may also cause stress, especially if they occur day after day or in combination. Examples include working long hours, driving in traffic, caring for children, being in debt, or being in a difficult relationship. SIGNS AND SYMPTOMS Stress may cause emotional symptoms including, the following:  Anxiety. This is feeling worried, afraid, on edge, overwhelmed, or out of control.  Anger. This is feeling irritated or impatient.  Depression. This is feeling sad, down, helpless, or guilty.  Difficulty focusing, remembering, or making decisions. Stress may cause physical symptoms, including the following:   Aches and pains. These may affect your head, neck, back, stomach, or other areas of your body.  Tight muscles or clenched jaw.  Low energy or trouble sleeping. Stress may cause unhealthy behaviors, including the following:   Eating to feel better (overeating) or skipping meals.  Sleeping too little, too much, or both.  Working  too much or putting off tasks (procrastination).  Smoking, drinking alcohol, or using drugs to feel better. DIAGNOSIS  Stress is diagnosed through an assessment by your health care provider. Your health care provider will ask questions about your symptoms and any stressful life events.Your health care provider will also ask about your medical history and may order blood tests or other tests. Certain medical conditions and medicine can cause physical symptoms similar to stress. Mental illness can cause emotional symptoms and unhealthy behaviors similar to stress. Your health care provider may refer you to a mental health professional for further evaluation.  TREATMENT  Stress management is the recommended treatment for stress.The goals of stress management are reducing stressful life events and coping with stress in healthy ways.  Techniques for reducing stressful life events include the following:  Stress identification. Self-monitor for stress and identify what causes stress for you. These skills may help you to avoid some stressful events.  Time management. Set your priorities, keep a calendar of events, and learn to say "no." These tools can help you avoid making too many commitments. Techniques for coping with stress include the following:  Rethinking the problem. Try to think realistically about stressful events rather than ignoring them or overreacting. Try to find the positives in a stressful situation rather than focusing on the negatives.  Exercise. Physical exercise can release both physical and emotional tension. The key is to find a form of exercise you enjoy and do it regularly.  Relaxation techniques. These relax the body and mind. Examples include yoga, meditation, tai chi, biofeedback, deep  breathing, progressive muscle relaxation, listening to music, being out in nature, journaling, and other hobbies. Again, the key is to find one or more that you enjoy and can do  regularly.  Healthy lifestyle. Eat a balanced diet, get plenty of sleep, and do not smoke. Avoid using alcohol or drugs to relax.  Strong support network. Spend time with family, friends, or other people you enjoy being around.Express your feelings and talk things over with someone you trust. Counseling or talktherapy with a mental health professional may be helpful if you are having difficulty managing stress on your own. Medicine is typically not recommended for the treatment of stress.Talk to your health care provider if you think you need medicine for symptoms of stress. HOME CARE INSTRUCTIONS  Keep all follow-up visits as directed by your health care provider.  Take all medicines as directed by your health care provider. SEEK MEDICAL CARE IF:  Your symptoms get worse or you start having new symptoms.  You feel overwhelmed by your problems and can no longer manage them on your own. SEEK IMMEDIATE MEDICAL CARE IF:  You feel like hurting yourself or someone else.   This information is not intended to replace advice given to you by your health care provider. Make sure you discuss any questions you have with your health care provider.   Document Released: 05/28/2001 Document Revised: 12/23/2014 Document Reviewed: 07/27/2013 Elsevier Interactive Patient Education 2016 [employer]  

## 2015-12-30 ENCOUNTER — Other Ambulatory Visit: Payer: BLUE CROSS/BLUE SHIELD

## 2015-12-30 DIAGNOSIS — D508 Other iron deficiency anemias: Secondary | ICD-10-CM

## 2015-12-30 DIAGNOSIS — E782 Mixed hyperlipidemia: Secondary | ICD-10-CM

## 2015-12-30 DIAGNOSIS — I10 Essential (primary) hypertension: Secondary | ICD-10-CM

## 2015-12-30 DIAGNOSIS — E875 Hyperkalemia: Secondary | ICD-10-CM

## 2015-12-30 DIAGNOSIS — R7989 Other specified abnormal findings of blood chemistry: Secondary | ICD-10-CM

## 2016-01-01 LAB — SPECIMEN STATUS

## 2016-01-01 LAB — SPECIMEN STATUS REPORT

## 2016-01-02 LAB — LIPID PANEL
CHOLESTEROL TOTAL: 139 mg/dL (ref 100–199)
Chol/HDL Ratio: 6 ratio units — ABNORMAL HIGH (ref 0.0–5.0)
HDL: 23 mg/dL — AB (ref >39)
TRIGLYCERIDES: 652 mg/dL — AB (ref 0–149)

## 2016-01-02 LAB — CMP14+EGFR
ALBUMIN: 4.2 g/dL (ref 3.5–5.5)
ALT: 64 IU/L — ABNORMAL HIGH (ref 0–44)
AST: 38 IU/L (ref 0–40)
Albumin/Globulin Ratio: 1.7 (ref 1.1–2.5)
Alkaline Phosphatase: 94 IU/L (ref 39–117)
BILIRUBIN TOTAL: 0.3 mg/dL (ref 0.0–1.2)
BUN / CREAT RATIO: 16 (ref 9–20)
BUN: 14 mg/dL (ref 6–24)
CALCIUM: 9.2 mg/dL (ref 8.7–10.2)
CHLORIDE: 98 mmol/L (ref 96–106)
CO2: 24 mmol/L (ref 18–29)
Creatinine, Ser: 0.88 mg/dL (ref 0.76–1.27)
GFR, EST AFRICAN AMERICAN: 120 mL/min/{1.73_m2} (ref >59)
GFR, EST NON AFRICAN AMERICAN: 104 mL/min/{1.73_m2} (ref >59)
Globulin, Total: 2.5 g/dL (ref 1.5–4.5)
Glucose: 129 mg/dL — ABNORMAL HIGH (ref 65–99)
Potassium: 4.5 mmol/L (ref 3.5–5.2)
Sodium: 139 mmol/L (ref 134–144)
Total Protein: 6.7 g/dL (ref 6.0–8.5)

## 2016-01-02 LAB — CBC WITH DIFFERENTIAL/PLATELET
BASOS ABS: 0 10*3/uL (ref 0.0–0.2)
Basos: 0 %
EOS (ABSOLUTE): 0.2 10*3/uL (ref 0.0–0.4)
Eos: 2 %
HEMATOCRIT: 44.6 % (ref 37.5–51.0)
Hemoglobin: 15.3 g/dL (ref 12.6–17.7)
Immature Grans (Abs): 0 10*3/uL (ref 0.0–0.1)
Immature Granulocytes: 0 %
LYMPHS ABS: 4.3 10*3/uL — AB (ref 0.7–3.1)
Lymphs: 46 %
MCH: 31.3 pg (ref 26.6–33.0)
MCHC: 34.3 g/dL (ref 31.5–35.7)
MCV: 91 fL (ref 79–97)
MONOS ABS: 0.8 10*3/uL (ref 0.1–0.9)
Monocytes: 9 %
Neutrophils Absolute: 4 10*3/uL (ref 1.4–7.0)
Neutrophils: 43 %
Platelets: 232 10*3/uL (ref 150–379)
RBC: 4.89 x10E6/uL (ref 4.14–5.80)
RDW: 12.4 % (ref 12.3–15.4)
WBC: 9.3 10*3/uL (ref 3.4–10.8)

## 2016-01-02 LAB — TSH: TSH: 1.41 u[IU]/mL (ref 0.450–4.500)

## 2016-01-02 LAB — TESTOSTERONE,FREE AND TOTAL
TESTOSTERONE FREE: 8.3 pg/mL (ref 6.8–21.5)
Testosterone: 249 ng/dL — ABNORMAL LOW (ref 348–1197)

## 2016-01-02 LAB — SPECIMEN STATUS REPORT

## 2016-01-03 LAB — HGB A1C W/O EAG: HEMOGLOBIN A1C: 5.7 % — AB (ref 4.8–5.6)

## 2016-01-03 LAB — SPECIMEN STATUS REPORT

## 2016-01-17 ENCOUNTER — Other Ambulatory Visit: Payer: Self-pay | Admitting: Family Medicine

## 2016-05-04 ENCOUNTER — Other Ambulatory Visit: Payer: Self-pay | Admitting: Family Medicine

## 2016-05-31 ENCOUNTER — Other Ambulatory Visit: Payer: Self-pay | Admitting: Family Medicine

## 2016-06-17 ENCOUNTER — Other Ambulatory Visit: Payer: BLUE CROSS/BLUE SHIELD

## 2016-06-17 DIAGNOSIS — E291 Testicular hypofunction: Secondary | ICD-10-CM | POA: Diagnosis not present

## 2016-06-17 DIAGNOSIS — E782 Mixed hyperlipidemia: Secondary | ICD-10-CM

## 2016-06-17 DIAGNOSIS — R7989 Other specified abnormal findings of blood chemistry: Secondary | ICD-10-CM

## 2016-06-19 LAB — TESTOSTERONE,FREE AND TOTAL
TESTOSTERONE: 156 ng/dL — AB (ref 348–1197)
Testosterone, Free: 6.6 pg/mL — ABNORMAL LOW (ref 6.8–21.5)

## 2016-06-19 LAB — LIPID PANEL
CHOLESTEROL TOTAL: 125 mg/dL (ref 100–199)
Chol/HDL Ratio: 4.5 ratio units (ref 0.0–5.0)
HDL: 28 mg/dL — ABNORMAL LOW (ref >39)
TRIGLYCERIDES: 448 mg/dL — AB (ref 0–149)

## 2016-06-24 ENCOUNTER — Encounter: Payer: Self-pay | Admitting: Family Medicine

## 2016-06-24 ENCOUNTER — Ambulatory Visit (INDEPENDENT_AMBULATORY_CARE_PROVIDER_SITE_OTHER): Payer: BLUE CROSS/BLUE SHIELD | Admitting: Family Medicine

## 2016-06-24 VITALS — BP 140/88 | HR 84 | Temp 97.8°F | Ht 67.0 in | Wt 241.8 lb

## 2016-06-24 DIAGNOSIS — E291 Testicular hypofunction: Secondary | ICD-10-CM

## 2016-06-24 DIAGNOSIS — N528 Other male erectile dysfunction: Secondary | ICD-10-CM

## 2016-06-24 DIAGNOSIS — I10 Essential (primary) hypertension: Secondary | ICD-10-CM

## 2016-06-24 DIAGNOSIS — E781 Pure hyperglyceridemia: Secondary | ICD-10-CM | POA: Diagnosis not present

## 2016-06-24 NOTE — Progress Notes (Signed)
Subjective:  Patient ID: David Bartlett, male    DOB: 1971/08/16  Age: 45 y.o. MRN: [id number]  CC: Hyperlipidemia and testosterone deficiency   HPI David Bartlett presents for  follow-up of hypertension. Patient has no history of headache chest pain or shortness of breath or recent cough. Patient also denies symptoms of TIA such as numbness weakness lateralizing. Patient checks  blood pressure at home and has not had any elevated readings recently. Patient denies side effects from his medication. States taking it regularly.  Patient also  in for follow-up of elevated cholesterol. Doing well without complaints on current medication. Denies side effects of statin including myalgia and arthralgia and nausea. Also in today for liver function testing. Currently no chest pain, shortness of breath or other cardiovascular related symptoms noted.  Follow-up of hypogonadism.Patient was noted to have a low testosterone but just above the borderline when he was checked months ago. He was in for repeat blood work a few days ago and it has noted to drop significantly since that time. See if that is attached results showing total testosterone of 150 and free testosterone 6.6. He states that he is having frustration and resentment toward his wife due to her not working outside of the home. She does help take care of the home but he does all of the work with their kids. He does 80% of the cooking as well. He also holds a full-time job.   History David Bartlett has a past medical history of HTN (hypertension); Smoker; Left breast mass; Asthmatic bronchitis; and Abnormal EKG.   He has no past surgical history on file.   His family history includes Cancer in his father; Heart attack in his father and mother; Kidney disease in his mother.He reports that he has been smoking Cigarettes.  He has a 10 pack-year smoking history. He does not have any smokeless tobacco history on file. He reports that he drinks about 1.8 oz of  alcohol per week. He reports that he does not use illicit drugs.  Current Outpatient Prescriptions on File Prior to Visit  Medication Sig Dispense Refill  . amLODipine (NORVASC) 10 MG tablet TAKE ONE (1) TABLET EACH DAY 90 tablet 1  . atorvastatin (LIPITOR) 20 MG tablet TAKE ONE (1) TABLET EACH DAY 90 tablet 1  . hydrochlorothiazide (HYDRODIURIL) 25 MG tablet TAKE ONE (1) TABLET EACH DAY 90 tablet 0  . losartan (COZAAR) 100 MG tablet Take 1 tablet (100 mg total) by mouth daily. 90 tablet 1  . VENTOLIN HFA 108 (90 Base) MCG/ACT inhaler USE 2 PUFFS EVERY 6 HOURS AS NEEDED 18 g 0   No current facility-administered medications on file prior to visit.    ROS Review of Systems  Constitutional: Negative for fever, chills, diaphoresis and unexpected weight change.  HENT: Negative for congestion, hearing loss, rhinorrhea and sore throat.   Eyes: Negative for visual disturbance.  Respiratory: Negative for cough and shortness of breath.   Cardiovascular: Negative for chest pain.  Gastrointestinal: Negative for abdominal pain, diarrhea and constipation.  Genitourinary: Negative for dysuria and flank pain.  Musculoskeletal: Negative for joint swelling and arthralgias.  Skin: Negative for rash.  Neurological: Negative for dizziness and headaches.  Psychiatric/Behavioral: Negative for sleep disturbance and dysphoric mood.    Objective:  BP 140/88 mmHg  Pulse 84  Temp(Src) 97.8 F (36.6 C) (Oral)  Ht 5\' 7"  (1.702 m)  Wt 241 lb 12.8 oz (109.68 kg)  BMI 37.86 kg/m2  SpO2 95%  BP Readings from  Last 3 Encounters:  06/24/16 140/88  12/29/15 143/87  10/10/15 145/95    Wt Readings from Last 3 Encounters:  06/24/16 241 lb 12.8 oz (109.68 kg)  12/29/15 238 lb (107.956 kg)  07/14/15 230 lb 12.8 oz (104.69 kg)     Physical Exam  Constitutional: He is oriented to person, place, and time. He appears well-developed and well-nourished. No distress.  Obese  HENT:  Head: Normocephalic and  atraumatic.  Right Ear: External ear normal.  Left Ear: External ear normal.  Nose: Nose normal.  Mouth/Throat: Oropharynx is clear and moist.  Eyes: Conjunctivae and EOM are normal. Pupils are equal, round, and reactive to light.  Neck: Normal range of motion. Neck supple. No thyromegaly present.  Cardiovascular: Normal rate, regular rhythm and normal heart sounds.   No murmur heard. Pulmonary/Chest: Effort normal and breath sounds normal. No respiratory distress. He has no wheezes. He has no rales.  Abdominal: Soft. Bowel sounds are normal. He exhibits no distension. There is no tenderness.  Lymphadenopathy:    He has no cervical adenopathy.  Neurological: He is alert and oriented to person, place, and time. He has normal reflexes.  Skin: Skin is warm and dry.  Psychiatric: He has a normal mood and affect. His behavior is normal. Judgment and thought content normal.    Lab Results  Component Value Date   HGBA1C 5.7* 12/30/2015   HGBA1C 5.9* 07/15/2015    Lab Results  Component Value Date   WBC WILL FOLLOW 12/30/2015   WBC 9.3 12/30/2015   HGB 14* 10/28/2014   HCT WILL FOLLOW 12/30/2015   HCT 44.6 12/30/2015   PLT WILL FOLLOW 12/30/2015   PLT 232 12/30/2015   GLUCOSE 129* 12/30/2015   CHOL 125 06/17/2016   TRIG 448* 06/17/2016   HDL 28* 06/17/2016   LDLCALC Comment 06/17/2016   ALT 64* 12/30/2015   AST 38 12/30/2015   NA 139 12/30/2015   K 4.5 12/30/2015   CL 98 12/30/2015   CREATININE 0.88 12/30/2015   BUN 14 12/30/2015   CO2 24 12/30/2015   TSH 1.410 12/30/2015   PSA 0.2 10/28/2014   HGBA1C 5.7* 12/30/2015    Mm Digital Diagnostic Unilat L  05/10/2011  *RADIOLOGY REPORT* Clinical Data:  45 year old male with left breast swelling and tenderness. DIGITAL DIAGNOSTIC LEFT MAMMOGRAM WITH CAD Comparison:  None. Findings:  There is flame-shaped fibroglandular density behind the left breast consistent with gynecomastia.  There is no mass, distortion or calcification to  suggest malignancy. Mammographic images were processed with CAD. IMPRESSION: Gynecomastia.  No follow-up needed. BI-RADS CATEGORY 2:  Benign finding(s). Original Report Authenticated By: Chaney Born, M.D.   Assessment & Plan:   David Bartlett was seen today for hyperlipidemia and testosterone deficiency.  Diagnoses and all orders for this visit:  Hypertriglyceridemia -     Triglycerides  Essential hypertension -     Triglycerides  Morbid obesity due to excess calories ([place])  Other male erectile dysfunction -     Testosterone,Free and Total  Hypogonadism in male -     Testosterone,Free and Total   I have discontinued Mr. Cimorelli montelukast. I am also having him maintain his amLODipine, atorvastatin, losartan, hydrochlorothiazide, VENTOLIN HFA, Cetirizine HCl, cholecalciferol, and aspirin.  Meds ordered this encounter  Medications  . Cetirizine HCl 10 MG TBDP    Sig: Take 1 tablet by mouth daily.  . cholecalciferol (VITAMIN D) 1000 units tablet    Sig: Take 1,000 Units by mouth daily.  Marland Kitchen aspirin  81 MG tablet    Sig: Take 81 mg by mouth daily.   Weight loss via Mediterranean Diet, handout given [facility] recommended  Before initiating therapy for testosterone replacement he will need to have a confirmation of his testosterone level performed. This has been ordered to be done prior to 10 AM later this week. Follow-up: Return in about 2 months (around 08/25/2016).  Claretta Fraise, M.D.

## 2016-06-24 NOTE — Patient Instructions (Signed)

## 2016-07-04 ENCOUNTER — Other Ambulatory Visit: Payer: Self-pay | Admitting: Family Medicine

## 2016-07-27 ENCOUNTER — Other Ambulatory Visit: Payer: Self-pay | Admitting: Family Medicine

## 2016-08-29 ENCOUNTER — Other Ambulatory Visit: Payer: Self-pay | Admitting: Family Medicine

## 2016-11-01 ENCOUNTER — Other Ambulatory Visit: Payer: Self-pay | Admitting: Family Medicine

## 2016-11-25 ENCOUNTER — Other Ambulatory Visit: Payer: Self-pay | Admitting: Family Medicine

## 2017-02-20 ENCOUNTER — Other Ambulatory Visit: Payer: Self-pay | Admitting: Family Medicine

## 2017-02-20 NOTE — Telephone Encounter (Signed)
Forwarding to PCP/stacks 

## 2017-02-21 NOTE — Telephone Encounter (Signed)
Detailed message left for patient that he would need to be seen.

## 2017-02-21 NOTE — Telephone Encounter (Signed)
Authorize 30 days only. Then contact the patient letting them know that they will need an appointment before any further prescriptions can be sent in. 

## 2017-03-18 ENCOUNTER — Ambulatory Visit (INDEPENDENT_AMBULATORY_CARE_PROVIDER_SITE_OTHER): Payer: BLUE CROSS/BLUE SHIELD

## 2017-03-18 ENCOUNTER — Ambulatory Visit (INDEPENDENT_AMBULATORY_CARE_PROVIDER_SITE_OTHER): Payer: BLUE CROSS/BLUE SHIELD | Admitting: Family Medicine

## 2017-03-18 ENCOUNTER — Encounter: Payer: Self-pay | Admitting: Family Medicine

## 2017-03-18 VITALS — BP 151/84 | HR 87 | Temp 97.1°F | Ht 67.0 in | Wt 248.0 lb

## 2017-03-18 DIAGNOSIS — E782 Mixed hyperlipidemia: Secondary | ICD-10-CM | POA: Diagnosis not present

## 2017-03-18 DIAGNOSIS — M25552 Pain in left hip: Secondary | ICD-10-CM

## 2017-03-18 DIAGNOSIS — I10 Essential (primary) hypertension: Secondary | ICD-10-CM | POA: Diagnosis not present

## 2017-03-18 MED ORDER — PREDNISONE 10 MG PO TABS: ORAL_TABLET | ORAL | 0 refills | Status: DC

## 2017-03-18 MED ORDER — VALSARTAN 320 MG PO TABS: 320.0000 mg | ORAL_TABLET | Freq: Every day | ORAL | 3 refills | Status: DC

## 2017-03-18 NOTE — Progress Notes (Signed)
Subjective:  Patient ID: David Bartlett, male    DOB: Oct 17, 1971  Age: 46 y.o. MRN: [id number]  CC: Hyperlipidemia (pt here today for routine follow up on his cholesterol and also c/o left hip pain that radiates x 3 months.)   HPI David Bartlett presents for  follow-up of hypertension. Patient has no history of headache chest pain or shortness of breath or recent cough. Patient also denies symptoms of TIA such as numbness weakness lateralizing. Patient does not check  blood pressure at home . He loves to cook and 8 red meat he grills a lot outside and enjoys the challenge of mastering a new recipe. He knows he needs to control portions. The pressure likely up due to weight gain and obesity. Patient denies side effects from his medication. States taking it regularly.  Patient in for follow-up of elevated cholesterol. Unfortunately as above he loves to eat including red meat. Doing well without complaints on current medication. Denies side effects of statin including myalgia and arthralgia and nausea. Also in today for liver function testing. Currently no chest pain, shortness of breath or other cardiovascular related symptoms noted. Patient relates left hip pain ongoing for several months. Some temporary relief initially with taking over-the-counter Motrin several pills a day. It's not helping very much now. This is interfering with his ability to exercise. The patient is moderately severe at 7/10. 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 He feels this took a toll on his joints. He's also been a very active in sports over the years and had injuries. History Asa has a past medical history of Abnormal EKG; Asthmatic bronchitis; HTN (hypertension); Left breast mass; and Smoker.   He has no past surgical history on file.   His family history includes Cancer in his father; Heart attack in his father and mother; Kidney disease in his mother.He  reports that he has been smoking Cigarettes.  He has a 10.00 pack-year smoking history. He has never used smokeless tobacco. He reports that he drinks about 1.8 oz of alcohol per week . He reports that he does not use drugs.    ROS Review of Systems  Constitutional: Negative for chills, diaphoresis and fever.  HENT: Negative for rhinorrhea and sore throat.   Respiratory: Negative for cough and shortness of breath.   Cardiovascular: Negative for chest pain.  Gastrointestinal: Negative for abdominal pain.  Musculoskeletal: Positive for arthralgias and myalgias.  Skin: Negative for rash.  Neurological: Negative for weakness and headaches.    Objective:  BP (!) 151/84   Pulse 87   Temp 97.1 F (36.2 C) (Oral)   Ht '5\' 7"'  (1.702 m)   Wt 248 lb (112.5 kg)   BMI 38.84 kg/m   BP Readings from Last 3 Encounters:  03/18/17 (!) 151/84  06/24/16 140/88  12/29/15 (!) 143/87    Wt Readings from Last 3 Encounters:  03/18/17 248 lb (112.5 kg)  06/24/16 241 lb 12.8 oz (109.7 kg)  12/29/15 238 lb (108 kg)     Physical Exam  Constitutional: He is oriented to person, place, and time. He appears well-developed and well-nourished. No distress.  HENT:  Head: Normocephalic and atraumatic.  Right Ear: External ear normal.  Left Ear: External ear normal.  Nose: Nose normal.  Mouth/Throat: Oropharynx is clear and moist.  Eyes: Conjunctivae and EOM are normal. Pupils are equal, round, and reactive to light.  Neck: Normal range of  motion. Neck supple. No thyromegaly present.  Cardiovascular: Normal rate, regular rhythm and normal heart sounds.   No murmur heard. Pulmonary/Chest: Effort normal and breath sounds normal. No respiratory distress. He has no wheezes. He has no rales.  Abdominal: Soft. Bowel sounds are normal. He exhibits no distension. There is no tenderness.  Musculoskeletal: He exhibits tenderness (wiith passive ROM Left hip, all directions).  Lymphadenopathy:    He has no  cervical adenopathy.  Neurological: He is alert and oriented to person, place, and time. He has normal reflexes.  Skin: Skin is warm and dry.  Psychiatric: He has a normal mood and affect. His behavior is normal. Judgment and thought content normal.      Assessment & Plan:   David Bartlett was seen today for hyperlipidemia.  Diagnoses and all orders for this visit:  Essential hypertension -     CMP14+EGFR -     CBC with Differential/Platelet  Mixed hyperlipidemia -     Lipid panel -     CMP14+EGFR -     CBC with Differential/Platelet  Left hip pain -     DG HIP UNILAT W OR W/O PELVIS 2-3 VIEWS LEFT; Future  Other orders -     valsartan (DIOVAN) 320 MG tablet; Take 1 tablet (320 mg total) by mouth daily. For Blood pressure -     predniSONE (DELTASONE) 10 MG tablet; Take 5 daily for 3 days followed by 4,3,2 and 1 for 3 days each.       I have discontinued Mr. Lovelady losartan. I am also having him start on valsartan and predniSONE. Additionally, I am having him maintain his amLODipine, Cetirizine HCl, cholecalciferol, aspirin, atorvastatin, hydrochlorothiazide, and VENTOLIN HFA.  Allergies as of 03/18/2017      Reactions   Penicillins       Medication List       Accurate as of 03/18/17  9:34 AM. Always use your most recent med list.          amLODipine 10 MG tablet Commonly known as:  NORVASC TAKE ONE (1) TABLET EACH DAY   aspirin 81 MG tablet Take 81 mg by mouth daily.   atorvastatin 20 MG tablet Commonly known as:  LIPITOR TAKE ONE (1) TABLET EACH DAY   Cetirizine HCl 10 MG Tbdp Take 1 tablet by mouth daily.   cholecalciferol 1000 units tablet Commonly known as:  VITAMIN D Take 1,000 Units by mouth daily.   hydrochlorothiazide 25 MG tablet Commonly known as:  HYDRODIURIL TAKE ONE (1) TABLET EACH DAY   predniSONE 10 MG tablet Commonly known as:  DELTASONE Take 5 daily for 3 days followed by 4,3,2 and 1 for 3 days each.   valsartan 320 MG  tablet Commonly known as:  DIOVAN Take 1 tablet (320 mg total) by mouth daily. For Blood pressure   VENTOLIN HFA 108 (90 Base) MCG/ACT inhaler Generic drug:  albuterol USE 2 PUFFS EVERY 6 HOURS AS NEEDED        Follow-up: Return in about 6 weeks (around 04/29/2017).  Claretta Fraise, M.D.

## 2017-03-19 ENCOUNTER — Other Ambulatory Visit: Payer: Self-pay | Admitting: Family Medicine

## 2017-03-31 ENCOUNTER — Telehealth: Payer: Self-pay | Admitting: Family Medicine

## 2017-03-31 NOTE — Telephone Encounter (Signed)
Returned patient's phone call.  Patient states that hip pain has worsened over the last 4 days.  He states that pain has moved down into left leg.  Patient has been taking 800mg  OTC Motrin with some relief. Patient would like to know if he can have something a little stronger to help with pain

## 2017-04-01 ENCOUNTER — Ambulatory Visit (INDEPENDENT_AMBULATORY_CARE_PROVIDER_SITE_OTHER): Payer: BLUE CROSS/BLUE SHIELD | Admitting: Family Medicine

## 2017-04-01 ENCOUNTER — Encounter: Payer: Self-pay | Admitting: Family Medicine

## 2017-04-01 VITALS — BP 143/87 | HR 90 | Temp 99.0°F | Ht 67.0 in | Wt 243.0 lb

## 2017-04-01 DIAGNOSIS — I10 Essential (primary) hypertension: Secondary | ICD-10-CM | POA: Diagnosis not present

## 2017-04-01 DIAGNOSIS — M25552 Pain in left hip: Secondary | ICD-10-CM

## 2017-04-01 MED ORDER — TRAMADOL HCL 50 MG PO TABS: ORAL_TABLET | ORAL | 0 refills | Status: DC

## 2017-04-01 MED ORDER — PREDNISONE 10 MG PO TABS: ORAL_TABLET | ORAL | 0 refills | Status: DC

## 2017-04-01 MED ORDER — BETAMETHASONE SOD PHOS & ACET 6 (3-3) MG/ML IJ SUSP: 6.0000 mg | Freq: Once | INTRAMUSCULAR | Status: DC

## 2017-04-01 NOTE — Progress Notes (Signed)
Subjective:  Patient ID: David Bartlett, male    DOB: [birth year]  Age: 46 y.o. MRN: [id number]  CC: Hip Pain (pt here today c/o worsening hip and leg pain)   HPI [name] presents for left hip pain ongoing for several months.Getting worse in spite of use of prednisone. Left hip laterally radiating into groin is sharp. This is interfering with his ability to exercise. The patient is moderately severe at 7/10.  BP high - pt. Feels due to pain. Using meds as directed. Not high aat home.    History [name] has a past medical history of Abnormal EKG; Asthmatic bronchitis; HTN (hypertension); Left breast mass; and Smoker.   He has no past surgical history on file.   His family history includes Cancer in his father; Heart attack in his father and mother; Kidney disease in his mother.He reports that he has been smoking Cigarettes.  He has a 10.00 pack-year smoking history. He has never used smokeless tobacco. He reports that he drinks about 1.8 oz of alcohol per week . He reports that he does not use drugs.    ROS Review of Systems  Constitutional: Negative for chills, diaphoresis and fever.  HENT: Negative for rhinorrhea and sore throat.   Respiratory: Negative for cough and shortness of breath.   Cardiovascular: Negative for chest pain.  Gastrointestinal: Negative for abdominal pain.  Musculoskeletal: Positive for arthralgias and myalgias.  Skin: Negative for rash.  Neurological: Negative for weakness and headaches.    Objective:  BP (!) 143/87   Pulse 90   Temp 99 F (37.2 C) (Oral)   Ht 5\' 7"  (1.702 m)   Wt 243 lb (110.2 kg)   BMI 38.06 kg/m   BP Readings from Last 3 Encounters:  04/01/17 (!) 143/87  03/18/17 (!) 151/84  06/24/16 140/88    Wt Readings from Last 3 Encounters:  04/01/17 243 lb (110.2 kg)  03/18/17 248 lb (112.5 kg)  06/24/16 241 lb 12.8 oz (109.7 kg)     Physical Exam  Constitutional: He is oriented to person, place, and time. He appears  well-developed and well-nourished. No distress.  HENT:  Head: Normocephalic and atraumatic.  Right Ear: External ear normal.  Left Ear: External ear normal.  Eyes: Pupils are equal, round, and reactive to light.  Cardiovascular: Normal rate, regular rhythm and normal heart sounds.   No murmur heard. Pulmonary/Chest: Effort normal and breath sounds normal. No respiratory distress. He has no wheezes. He has no rales.  Musculoskeletal: He exhibits tenderness (wiith passive ROM Left hip, all directions).  Neurological: He is alert and oriented to person, place, and time. He has normal reflexes.  Skin: Skin is warm and dry.  Psychiatric: He has a normal mood and affect. His behavior is normal. Thought content normal.      Assessment & Plan:   [name] was seen today for hip pain.  Diagnoses and all orders for this visit:  Left hip pain -     betamethasone acetate-betamethasone sodium phosphate (CELESTONE) injection 6 mg; Inject 1 mL (6 mg total) into the muscle once. -     Ambulatory referral to Physical Therapy -     MR HIP LEFT W WO CONTRAST; Future  Essential hypertension  Other orders -     predniSONE (DELTASONE) 10 MG tablet; Take 5 daily for 3 days followed by 4,3,2 and 1 for 3 days each. -     traMADol (ULTRAM) 50 MG tablet; Take one 2-4 times daily as needed  for left hip pain.       I am having Mr. Barsch start on traMADol. I am also having him maintain his amLODipine, Cetirizine HCl, cholecalciferol, aspirin, hydrochlorothiazide, valsartan, atorvastatin, and predniSONE. We will continue to administer betamethasone acetate-betamethasone sodium phosphate.  Allergies as of 04/01/2017      Reactions   Penicillins       Medication List       Accurate as of 04/01/17 11:59 PM. Always use your most recent med list.          amLODipine 10 MG tablet Commonly known as:  NORVASC TAKE ONE (1) TABLET EACH DAY   aspirin 81 MG tablet Take 81 mg by mouth daily.     atorvastatin 20 MG tablet Commonly known as:  LIPITOR TAKE ONE (1) TABLET EACH DAY   Cetirizine HCl 10 MG Tbdp Take 1 tablet by mouth daily.   cholecalciferol 1000 units tablet Commonly known as:  VITAMIN D Take 1,000 Units by mouth daily.   hydrochlorothiazide 25 MG tablet Commonly known as:  HYDRODIURIL TAKE ONE (1) TABLET EACH DAY   predniSONE 10 MG tablet Commonly known as:  DELTASONE Take 5 daily for 3 days followed by 4,3,2 and 1 for 3 days each.   traMADol 50 MG tablet Commonly known as:  ULTRAM Take one 2-4 times daily as needed for left hip pain.   valsartan 320 MG tablet Commonly known as:  DIOVAN Take 1 tablet (320 mg total) by mouth daily. For Blood pressure   VENTOLIN HFA 108 (90 Base) MCG/ACT inhaler Generic drug:  albuterol USE 2 PUFFS EVERY 6 HOURS AS NEEDED        Follow-up: Return in about 2 weeks (around 04/15/2017).  Claretta Fraise, M.D.

## 2017-04-01 NOTE — Telephone Encounter (Signed)
Please contact the patient In order to prescribe stronger meds, treatments he will need to follow up here.

## 2017-04-02 ENCOUNTER — Other Ambulatory Visit: Payer: Self-pay | Admitting: Family Medicine

## 2017-04-10 ENCOUNTER — Ambulatory Visit: Payer: BLUE CROSS/BLUE SHIELD | Attending: Family Medicine | Admitting: Physical Therapy

## 2017-04-10 ENCOUNTER — Encounter: Payer: Self-pay | Admitting: Physical Therapy

## 2017-04-10 DIAGNOSIS — M25652 Stiffness of left hip, not elsewhere classified: Secondary | ICD-10-CM

## 2017-04-10 DIAGNOSIS — M25552 Pain in left hip: Secondary | ICD-10-CM

## 2017-04-10 NOTE — Therapy (Signed)
[facility] [place], [place], [address] Phone: [phone]   Fax:  [phone]  Physical Therapy Evaluation  Patient Details  Name: David Bartlett MRN: [id number] Date of Birth: Jul 25, 1971 Referring Provider: Claretta Fraise MD  Encounter Date: 04/10/2017      PT End of Session - 04/10/17 1421    Visit Number 1   Number of Visits 12   Date for PT Re-Evaluation 05/22/17   PT Start Time 1030   PT Stop Time 1120   PT Time Calculation (min) 50 min   Activity Tolerance Patient tolerated treatment well   Behavior During Therapy [facility] for tasks assessed/performed      Past Medical History:  Diagnosis Date  . Abnormal EKG   . Asthmatic bronchitis   . HTN (hypertension)   . Left breast mass   . Smoker     No past surgical history on file.  There were no vitals filed for this visit.       Subjective Assessment - 04/10/17 1415    Subjective The patient presents with left hip pain that has been worsenening over the last 6 months.  His X-ray was negative.  He has an MRI scheduled.  He tried Prednisone but he said his pain increased.  His pain-level today is a 5/10 today but with cold weather and certain hip motions his pain rises to 7-8+/10 that can be very sharp in nature.  He said a coritsone shot was helpful though.  The patient reports no low back pain.            [facility] PT Assessment - 04/10/17 0001      Assessment   Medical Diagnosis Left hip pain.   Referring Provider Claretta Fraise MD   Onset Date/Surgical Date --  6 months.     Precautions   Precautions None     Restrictions   Weight Bearing Restrictions No     Balance Screen   Has the patient fallen in the past 6 months No   Has the patient had a decrease in activity level because of a fear of falling?  No   Is the patient reluctant to leave their home because of a fear of falling?  No     Home Ecologist residence      Prior Function   Level of Independence Independent     Posture/Postural Control   Posture/Postural Control No significant limitations     ROM / Strength   AROM / PROM / Strength AROM;Strength     AROM   Overall AROM Comments SLR limited to 45 degrees.  Left hip IR/ER decreased by 50%.     Strength   Overall Strength Comments Normal left LE strength.     Palpation   Palpation comment Patient c/o diffuse pain around the musculature surrounding the greater trochanter and referred pain into his left groin.     Special Tests    Special Tests Hip Special Tests   Hip Special Tests  Saralyn Pilar (FABER) Test;Hip Scouring     Saralyn Pilar [facility]) Test   Findings Positive   Side Left     Hip Scouring   Findings Positive   Side Left     Ambulation/Gait   Gait Pattern Decreased stance time - left;Antalgic                   [facility] Adult PT Treatment/Exercise - 04/10/17 0001      Modalities  Modalities Electrical Stimulation;Moist Heat     Moist Heat Therapy   Number Minutes Moist Heat 20 Minutes   Moist Heat Location --  Left hip.     Acupuncturist Location --  Left hip.   Electrical Stimulation Action --  IFC   Electrical Stimulation Parameters 80-150 Hz x 20 minutes.   Electrical Stimulation Goals Pain                     PT Long Term Goals - 04/10/17 1425      PT LONG TERM GOAL #1   Title Independent with a HEP.   Time 6   Period Weeks   Status New     PT LONG TERM GOAL #2   Title Perform ADL's with with pain not > 2/10.   Time 6   Period Weeks   Status New     PT LONG TERM GOAL #3   Title Non-antalgic gait.   Time 6   Period Weeks   Status New               Plan - 04/10/17 1422    Clinical Impression Statement The patient presents with at times severe pain in his left hip with certain movements and cold weather.  The and pain and limitation has adversely affected with ability to walk and  perform ADL's.  Patient will benefit from skilled physical therapy.   Rehab Potential Good   PT Frequency 2x / week   PT Duration 6 weeks   PT Treatment/Interventions ADLs/Self Care Home Management;Cryotherapy;Electrical Stimulation;Ultrasound;Moist Heat;Therapeutic activities;Therapeutic exercise;Patient/family education;Manual techniques;Dry needling   PT Next Visit Plan Combo e'stim/U/S to left lateral hip musculature; dry needling; "clamshell" and sdly hip abduction.   Consulted and Agree with Plan of Care Patient      Patient will benefit from skilled therapeutic intervention in order to improve the following deficits and impairments:  Abnormal gait, Decreased range of motion, Pain  Visit Diagnosis: Pain in left hip - Plan: PT plan of care cert/re-cert  Stiffness of left hip, not elsewhere classified - Plan: PT plan of care cert/re-cert     Problem List Patient Active Problem List   Diagnosis Date Noted  . Erectile dysfunction 12/29/2015  . Loss of libido 12/29/2015  . Abnormal EKG 05/17/2011  . Hypertension 05/17/2011  . Hyperlipidemia 05/17/2011  . Tobacco abuse 05/17/2011    David Bartlett, David Bartlett 04/10/2017, 2:29 PM  [facility] [address] [place], [place], [address] Phone: [phone]   Fax:  [phone]  Name: David Bartlett MRN: [id number] Date of Birth: [birth year]

## 2017-04-18 ENCOUNTER — Ambulatory Visit: Payer: BLUE CROSS/BLUE SHIELD | Attending: Family Medicine | Admitting: Physical Therapy

## 2017-04-18 DIAGNOSIS — M25552 Pain in left hip: Secondary | ICD-10-CM | POA: Insufficient documentation

## 2017-04-18 DIAGNOSIS — M25652 Stiffness of left hip, not elsewhere classified: Secondary | ICD-10-CM | POA: Diagnosis not present

## 2017-04-18 NOTE — Therapy (Addendum)
[facility] [place], [place], [address] Phone: [phone]   Fax:  [phone]  Physical Therapy Treatment  Patient Details  Name: David Bartlett MRN: [id number] Date of Birth: [birth year] Referring Provider: Claretta Fraise MD  Encounter Date: 04/18/2017      PT End of Session - 04/18/17 1200    Visit Number 2   Number of Visits 12   Date for PT Re-Evaluation 05/22/17   PT Start Time 1030   PT Stop Time 1123   PT Time Calculation (min) 53 min      Past Medical History:  Diagnosis Date  . Abnormal EKG   . Asthmatic bronchitis   . HTN (hypertension)   . Left breast mass   . Smoker     No past surgical history on file.  There were no vitals filed for this visit.      Subjective Assessment - 04/18/17 1201    Subjective I'm geeting my MRI tomorrow.  My hip feel okay today.   Patient Stated Goals Get out of pain.   Pain Score 4    Pain Location Hip   Pain Orientation Left   Pain Descriptors / Indicators Aching;Sharp   Pain Type Acute pain   Pain Onset More than a month ago                         [facility] Adult PT Treatment/Exercise - 04/18/17 0001      Modalities   Modalities Electrical Stimulation;Ultrasound     Electrical Stimulation   Electrical Stimulation Location --  Left hip.   Electrical Stimulation Action --  IFC   Electrical Stimulation Parameters 80-150 Hz x 20 minutes.   Electrical Stimulation Goals Pain     Ultrasound   Ultrasound Location --  Left hip.   Ultrasound Parameters Patient in right sdly position with pillow between knees fro comfort:  Combo E'stim/U/S at 1.50 W/CM2 x 12 minutes.   Ultrasound Goals Pain     Manual Therapy   Manual Therapy Soft tissue mobilization   Soft tissue mobilization STW/M to left hip musculature with TFL release technique x 12 minutes.                     PT Long Term Goals - 04/10/17 1425      PT LONG TERM GOAL #1   Title Independent with a HEP.   Time 6   Period Weeks   Status New     PT LONG TERM GOAL #2   Title Perform ADL's with with pain not > 2/10.   Time 6   Period Weeks   Status New     PT LONG TERM GOAL #3   Title Non-antalgic gait.   Time 6   Period Weeks   Status New               Plan - 04/18/17 1205    Clinical Impression Statement Patient did well today with treatment.  He had a taut band in his left TFL.   PT Next Visit Plan Dry needling.      Patient will benefit from skilled therapeutic intervention in order to improve the following deficits and impairments:     Visit Diagnosis: Pain in left hip  Stiffness of left hip, not elsewhere classified     Problem List Patient Active Problem List   Diagnosis Date Noted  . Erectile dysfunction 12/29/2015  . Loss of  libido 12/29/2015  . Abnormal EKG 05/17/2011  . Hypertension 05/17/2011  . Hyperlipidemia 05/17/2011  . Tobacco abuse 05/17/2011    Lilybelle Mayeda, [place] MPT 04/18/2017, 12:07 PM  [facility] [address] [place], [place], [address] Phone: [phone]   Fax:  [phone]  Name: David Bartlett MRN: [id number] Date of Birth: 20-Nov-1971  PHYSICAL THERAPY DISCHARGE SUMMARY  Visits from Start of Care: 2.  Current functional level related to goals / functional outcomes: See above   Remaining deficits: Patient to have surgery.  Education / Equipment:  Plan: Patient agrees to discharge.  Patient goals were not met. Patient is being discharged due to the physician's request.  ?????         Mali Shabre Kreher MPT

## 2017-04-19 ENCOUNTER — Ambulatory Visit
Admission: RE | Admit: 2017-04-19 | Discharge: 2017-04-19 | Disposition: A | Discharge status: Home / Self Care | Payer: BLUE CROSS/BLUE SHIELD | Source: Ambulatory Visit | Attending: Family Medicine | Admitting: Family Medicine

## 2017-04-19 DIAGNOSIS — M25552 Pain in left hip: Secondary | ICD-10-CM | POA: Diagnosis not present

## 2017-04-19 MED ORDER — GADOBENATE DIMEGLUMINE 529 MG/ML IV SOLN
20.0000 mL | Freq: Once | INTRAVENOUS | Status: AC | PRN
  Administered 2017-04-19: 20 mL via INTRAVENOUS

## 2017-04-22 ENCOUNTER — Telehealth: Payer: Self-pay | Admitting: Family Medicine

## 2017-04-22 ENCOUNTER — Other Ambulatory Visit: Payer: Self-pay | Admitting: Family Medicine

## 2017-04-22 DIAGNOSIS — M87051 Idiopathic aseptic necrosis of right femur: Secondary | ICD-10-CM

## 2017-04-22 DIAGNOSIS — M87052 Idiopathic aseptic necrosis of left femur: Principal | ICD-10-CM

## 2017-04-22 NOTE — Telephone Encounter (Signed)
Patient wanting to know the MRI results of his left hip from 5/5. Please review and send back to pools.

## 2017-04-25 ENCOUNTER — Encounter: Payer: PRIVATE HEALTH INSURANCE | Admitting: Physical Therapy

## 2017-05-06 DIAGNOSIS — F17210 Nicotine dependence, cigarettes, uncomplicated: Secondary | ICD-10-CM | POA: Diagnosis not present

## 2017-05-06 DIAGNOSIS — M87052 Idiopathic aseptic necrosis of left femur: Secondary | ICD-10-CM | POA: Diagnosis not present

## 2017-05-06 DIAGNOSIS — M87051 Idiopathic aseptic necrosis of right femur: Secondary | ICD-10-CM | POA: Diagnosis not present

## 2017-05-08 ENCOUNTER — Telehealth: Payer: Self-pay | Admitting: Family Medicine

## 2017-05-09 ENCOUNTER — Other Ambulatory Visit: Payer: Self-pay | Admitting: Family Medicine

## 2017-05-09 MED ORDER — VARENICLINE TARTRATE 0.5 MG X 11 & 1 MG X 42 PO MISC: ORAL | 0 refills | Status: DC

## 2017-05-09 NOTE — Telephone Encounter (Signed)
pt aware 

## 2017-05-09 NOTE — Telephone Encounter (Signed)
I sent in the requested prescription 

## 2017-05-31 ENCOUNTER — Other Ambulatory Visit: Payer: Self-pay | Admitting: Family Medicine

## 2017-06-02 ENCOUNTER — Other Ambulatory Visit: Payer: Self-pay | Admitting: Family Medicine

## 2017-06-11 ENCOUNTER — Telehealth: Payer: Self-pay | Admitting: Family Medicine

## 2017-06-11 NOTE — Telephone Encounter (Signed)
Needed surgical clearance - apt made.

## 2017-06-20 ENCOUNTER — Encounter: Payer: Self-pay | Admitting: Family Medicine

## 2017-06-20 ENCOUNTER — Ambulatory Visit (INDEPENDENT_AMBULATORY_CARE_PROVIDER_SITE_OTHER): Payer: BLUE CROSS/BLUE SHIELD | Admitting: Family Medicine

## 2017-06-20 VITALS — BP 122/75 | HR 92 | Temp 97.8°F | Ht 67.0 in | Wt 253.0 lb

## 2017-06-20 DIAGNOSIS — M87052 Idiopathic aseptic necrosis of left femur: Secondary | ICD-10-CM | POA: Diagnosis not present

## 2017-06-20 DIAGNOSIS — Z720 Tobacco use: Secondary | ICD-10-CM

## 2017-06-20 DIAGNOSIS — I10 Essential (primary) hypertension: Secondary | ICD-10-CM | POA: Diagnosis not present

## 2017-06-20 DIAGNOSIS — M25552 Pain in left hip: Secondary | ICD-10-CM

## 2017-06-20 MED ORDER — VARENICLINE TARTRATE 1 MG PO TABS: 1.0000 mg | ORAL_TABLET | Freq: Two times a day (BID) | ORAL | 5 refills | Status: DC

## 2017-06-20 MED ORDER — ATORVASTATIN CALCIUM 20 MG PO TABS: ORAL_TABLET | ORAL | 1 refills | Status: DC

## 2017-06-20 MED ORDER — AMLODIPINE BESYLATE 10 MG PO TABS: ORAL_TABLET | ORAL | 1 refills | Status: DC

## 2017-06-20 MED ORDER — VALSARTAN 320 MG PO TABS: 320.0000 mg | ORAL_TABLET | Freq: Every day | ORAL | 3 refills | Status: DC

## 2017-06-20 MED ORDER — HYDROCHLOROTHIAZIDE 25 MG PO TABS: ORAL_TABLET | ORAL | 1 refills | Status: DC

## 2017-06-20 NOTE — Progress Notes (Signed)
Subjective:  Patient ID: David Bartlett, male    DOB: June 26, 1971  Age: 46 y.o. MRN: [id number]  CC: Pre-op Exam (pt here today for sugical clearance to have hip replacement)   HPI David Bartlett presents for Now off cigarettes for 30 days. Doesn't have a strong desire to smoke at all. Feels that he'll save money with the Chantix. He needs clearance today for orthopedic surgery for left hip replacement. There is a plan also to do the right hip in about a year. He is working on weight control. Denies shortness of breath  Depression screen David Bartlett 2/9 03/18/2017 06/24/2016 12/29/2015  Decreased Interest 0 0 0  Down, Depressed, Hopeless 0 0 0  PHQ - 2 Score 0 0 0    History David Bartlett has a past medical history of Abnormal EKG; Asthmatic bronchitis; HTN (hypertension); Left breast mass; and Smoker.   He has no past surgical history on file.   His family history includes Cancer in his father; Heart attack in his father and mother; Kidney disease in his mother.He reports that he quit smoking about 4 weeks ago. His smoking use included Cigarettes. He has a 10.00 pack-year smoking history. He has never used smokeless tobacco. He reports that he drinks about 1.8 oz of alcohol per week . He reports that he does not use drugs.    ROS Review of Systems  Constitutional: Negative for chills, diaphoresis, fever and unexpected weight change.  HENT: Negative for congestion, hearing loss, rhinorrhea and sore throat.   Eyes: Negative for visual disturbance.  Respiratory: Negative for cough and shortness of breath.   Cardiovascular: Negative for chest pain.  Gastrointestinal: Negative for abdominal pain, constipation and diarrhea.  Genitourinary: Negative for dysuria and flank pain.  Musculoskeletal: Negative for arthralgias and joint swelling.  Skin: Negative for rash.  Neurological: Negative for dizziness and headaches.  Psychiatric/Behavioral: Negative for dysphoric mood and sleep disturbance.     Objective:  BP 122/75   Pulse 92   Temp 97.8 F (36.6 C) (Oral)   Ht 5\' 7"  (1.702 m)   Wt 253 lb (114.8 kg)   BMI 39.63 kg/m   BP Readings from Last 3 Encounters:  06/20/17 122/75  04/01/17 (!) 143/87  03/18/17 (!) 151/84    Wt Readings from Last 3 Encounters:  06/20/17 253 lb (114.8 kg)  04/01/17 243 lb (110.2 kg)  03/18/17 248 lb (112.5 kg)     Physical Exam  Constitutional: He is oriented to person, place, and time. He appears well-developed and well-nourished. No distress.  HENT:  Head: Normocephalic and atraumatic.  Right Ear: External ear normal.  Left Ear: External ear normal.  Nose: Nose normal.  Mouth/Throat: Oropharynx is clear and moist.  Eyes: Conjunctivae and EOM are normal. Pupils are equal, round, and reactive to light.  Neck: Normal range of motion. Neck supple. No thyromegaly present.  Cardiovascular: Normal rate, regular rhythm and normal heart sounds.   No murmur heard. Pulmonary/Chest: Effort normal and breath sounds normal. No respiratory distress. He has no wheezes. He has no rales.  Abdominal: Soft. Bowel sounds are normal. He exhibits no distension. There is no tenderness.  Lymphadenopathy:    He has no cervical adenopathy.  Neurological: He is alert and oriented to person, place, and time. He has normal reflexes.  Skin: Skin is warm and dry.  Psychiatric: He has a normal mood and affect. His behavior is normal. Judgment and thought content normal.      Assessment & Plan:  David Bartlett was seen today for pre-op exam.  Diagnoses and all orders for this visit:  Left hip pain  Essential hypertension  Tobacco abuse  Avascular necrosis of bone of hip, left (HCC)  Other orders -     amLODipine (NORVASC) 10 MG tablet; TAKE ONE (1) TABLET EACH DAY -     atorvastatin (LIPITOR) 20 MG tablet; TAKE ONE (1) TABLET EACH DAY -     hydrochlorothiazide (HYDRODIURIL) 25 MG tablet; TAKE ONE (1) TABLET EACH DAY -     valsartan (DIOVAN) 320 MG  tablet; Take 1 tablet (320 mg total) by mouth daily. For Blood pressure -     varenicline (CHANTIX CONTINUING MONTH PAK) 1 MG tablet; Take 1 tablet (1 mg total) by mouth 2 (two) times daily.       I have discontinued David Bartlett predniSONE and varenicline. I am also having him start on varenicline. Additionally, I am having him maintain his Cetirizine HCl, cholecalciferol, aspirin, traMADol, VENTOLIN HFA, amLODipine, atorvastatin, hydrochlorothiazide, and valsartan. We will stop administering betamethasone acetate-betamethasone sodium phosphate.  Allergies as of 06/20/2017      Reactions   Penicillins       Medication List       Accurate as of 06/20/17 11:38 AM. Always use your most recent med list.          amLODipine 10 MG tablet Commonly known as:  NORVASC TAKE ONE (1) TABLET EACH DAY   aspirin 81 MG tablet Take 81 mg by mouth daily.   atorvastatin 20 MG tablet Commonly known as:  LIPITOR TAKE ONE (1) TABLET EACH DAY   Cetirizine HCl 10 MG Tbdp Take 1 tablet by mouth daily.   cholecalciferol 1000 units tablet Commonly known as:  VITAMIN D Take 1,000 Units by mouth daily.   hydrochlorothiazide 25 MG tablet Commonly known as:  HYDRODIURIL TAKE ONE (1) TABLET EACH DAY   traMADol 50 MG tablet Commonly known as:  ULTRAM Take one 2-4 times daily as needed for left hip pain.   valsartan 320 MG tablet Commonly known as:  DIOVAN Take 1 tablet (320 mg total) by mouth daily. For Blood pressure   varenicline 1 MG tablet Commonly known as:  CHANTIX CONTINUING MONTH PAK Take 1 tablet (1 mg total) by mouth 2 (two) times daily.   VENTOLIN HFA 108 (90 Base) MCG/ACT inhaler Generic drug:  albuterol USE 2 PUFFS EVERY 6 HOURS AS NEEDED        Follow-up: Return in about 6 months (around 12/21/2017).  Claretta Fraise, M.D.

## 2017-06-20 NOTE — Assessment & Plan Note (Signed)
Hip replacement planned for the near future. Lesser degree present on the right

## 2017-07-09 ENCOUNTER — Ambulatory Visit: Payer: Self-pay | Admitting: Orthopedic Surgery

## 2017-07-22 ENCOUNTER — Ambulatory Visit: Payer: Self-pay | Admitting: Orthopedic Surgery

## 2017-07-22 NOTE — H&P (Signed)
TOTAL HIP ADMISSION H&P  Patient is admitted for left total hip arthroplasty.  Subjective:  Chief Complaint: left hip pain  HPI: David Bartlett, 46 y.o. male, has a history of pain and functional disability in the left hip(s) due to AVN and patient has failed non-surgical conservative treatments for greater than 12 weeks to include flexibility and strengthening excercises, use of assistive devices, weight reduction as appropriate and activity modification.  Onset of symptoms was gradual starting 3 years ago with gradually worsening course since that time.The patient noted no past surgery on the left hip(s).  Patient currently rates pain in the left hip at 10 out of 10 with activity. Patient has night pain, worsening of pain with activity and weight bearing, pain that interfers with activities of daily living and pain with passive range of motion. Patient has evidence of AVN with collapse by imaging studies. This condition presents safety issues increasing the risk of falls. There is no current active infection.  Patient Active Problem List   Diagnosis Date Noted  . Avascular necrosis of bone of hip, left (HCC) 06/20/2017  . Erectile dysfunction 12/29/2015  . Loss of libido 12/29/2015  . Hypertension 05/17/2011  . Hyperlipidemia 05/17/2011  . Tobacco abuse 05/17/2011   Past Medical History:  Diagnosis Date  . Abnormal EKG   . Asthmatic bronchitis   . HTN (hypertension)   . Left breast mass   . Smoker     No past surgical history on file.   (Not in a hospital admission) Allergies  Allergen Reactions  . Penicillins     Social History  Substance Use Topics  . Smoking status: Former Smoker    Packs/day: 0.50    Years: 20.00    Types: Cigarettes    Quit date: 05/21/2017  . Smokeless tobacco: Never Used  . Alcohol use 1.8 oz/week    3 Cans of beer per week    Family History  Problem Relation Age of Onset  . Kidney disease Mother   . Heart attack Mother   . Cancer Father   .  Heart attack Father      Review of Systems  Constitutional: Negative.   HENT: Negative.   Eyes: Negative.   Respiratory: Negative.   Cardiovascular: Positive for leg swelling.  Gastrointestinal: Negative.   Genitourinary: Negative.   Musculoskeletal: Positive for joint pain.  Skin: Negative.   Neurological: Negative.   Endo/Heme/Allergies: Negative.   Psychiatric/Behavioral: Negative.     Objective:  Physical Exam  Vitals reviewed. Constitutional: He is oriented to person, place, and time. He appears well-developed and well-nourished.  HENT:  Head: Normocephalic and atraumatic.  Eyes: Pupils are equal, round, and reactive to light. Conjunctivae and EOM are normal.  Neck: Normal range of motion. Neck supple.  Cardiovascular: Normal rate.   Respiratory: Effort normal and breath sounds normal. No respiratory distress.  GI: Soft. He exhibits no distension.  Genitourinary:  Genitourinary Comments: deferred  Musculoskeletal: He exhibits edema.       Left hip: He exhibits decreased range of motion, bony tenderness and swelling.  Neurological: He is alert and oriented to person, place, and time. He has normal reflexes.  Skin: Skin is warm and dry.  Psychiatric: He has a normal mood and affect. His behavior is normal. Judgment and thought content normal.    Vital signs in last 24 hours: @VSRANGES@  Labs:   Estimated body mass index is 39.63 kg/m as calculated from the following:   Height as of 06/20/17:   5' 7" (1.702 m).   Weight as of 06/20/17: 114.8 kg (253 lb).   Imaging Review Plain radiographs demonstrate mild degenerative joint disease of the left hip(s). The bone quality appears to be adequate for age and reported activity level. AVN with collapse.  Assessment/Plan:  End stage arthritis, left hip(s)  The patient history, physical examination, clinical judgement of the provider and imaging studies are consistent with end stage degenerative joint disease of the left  hip(s) and total hip arthroplasty is deemed medically necessary. The treatment options including medical management, injection therapy, arthroscopy and arthroplasty were discussed at length. The risks and benefits of total hip arthroplasty were presented and reviewed. The risks due to aseptic loosening, infection, stiffness, dislocation/subluxation,  thromboembolic complications and other imponderables were discussed.  The patient acknowledged the explanation, agreed to proceed with the plan and consent was signed. Patient is being admitted for inpatient treatment for surgery, pain control, PT, OT, prophylactic antibiotics, VTE prophylaxis, progressive ambulation and ADL's and discharge planning.The patient is planning to be discharged home with HEP 

## 2017-07-29 ENCOUNTER — Encounter: Payer: Self-pay | Admitting: Family Medicine

## 2017-07-29 ENCOUNTER — Ambulatory Visit (INDEPENDENT_AMBULATORY_CARE_PROVIDER_SITE_OTHER): Payer: BLUE CROSS/BLUE SHIELD | Admitting: Family Medicine

## 2017-07-29 VITALS — BP 149/88 | HR 97 | Ht 67.0 in | Wt 257.0 lb

## 2017-07-29 DIAGNOSIS — R609 Edema, unspecified: Secondary | ICD-10-CM | POA: Diagnosis not present

## 2017-07-29 DIAGNOSIS — I10 Essential (primary) hypertension: Secondary | ICD-10-CM | POA: Diagnosis not present

## 2017-07-29 MED ORDER — AMLODIPINE BESYLATE 5 MG PO TABS: 5.0000 mg | ORAL_TABLET | Freq: Every day | ORAL | 3 refills | Status: DC

## 2017-07-29 MED ORDER — TRIAMTERENE-HCTZ 75-50 MG PO TABS: 1.0000 | ORAL_TABLET | ORAL | 0 refills | Status: DC

## 2017-07-29 NOTE — Progress Notes (Signed)
Subjective:  Patient ID: David Bartlett, male    DOB: [birth year]  Age: 46 y.o. MRN: [id number]  CC: Edema (pt here today c/o bilateral lower leg edema since he stopped his Valsartan. He is having hip replacement surgery next Thursday and they won't proceed with the surgery if he has the swelling. )   HPI David Bartlett presents for Patient started swelling when he added the valsartan. He also increase the Norvasc to 10 mg a day. He continues to take the hydrochlorothiazide 25 mg a day. However he dropped to valsartan a few days ago. That did not seem to decrease the swelling. The swelling seemed to Be present at all times. Patient has tried wearing support stockings at night but not during the day. Swelling is not associated with pain. There is no redness. There is no shortness of breath or chest pain. Depression screen [facility] 2/9 03/18/2017 06/24/2016 12/29/2015  Decreased Interest 0 0 0  Down, Depressed, Hopeless 0 0 0  PHQ - 2 Score 0 0 0    History David Bartlett has a past medical history of Abnormal EKG; Asthmatic bronchitis; HTN (hypertension); Left breast mass; and Smoker.   He has no past surgical history on file.   His family history includes Cancer in his father; Heart attack in his father and mother; Kidney disease in his mother.He reports that he quit smoking about 2 months ago. His smoking use included Cigarettes. He has a 10.00 pack-year smoking history. He has never used smokeless tobacco. He reports that he drinks about 1.8 oz of alcohol per week . He reports that he does not use drugs.    ROS Review of Systems  Constitutional: Negative for chills, diaphoresis and fever.  HENT: Negative for sore throat.   Respiratory: Negative for cough and shortness of breath.   Cardiovascular: Positive for leg swelling. Negative for chest pain.  Gastrointestinal: Negative for abdominal pain.  Musculoskeletal: Negative for arthralgias and myalgias.  Skin: Negative for rash.  Neurological:  Negative for weakness and headaches.    Objective:  BP (!) 149/88   Pulse 97   Ht 5' 7" (1.702 m)   Wt 257 lb (116.6 kg)   BMI 40.25 kg/m   BP Readings from Last 3 Encounters:  07/29/17 (!) 149/88  06/20/17 122/75  04/01/17 (!) 143/87    Wt Readings from Last 3 Encounters:  07/29/17 257 lb (116.6 kg)  06/20/17 253 lb (114.8 kg)  04/01/17 243 lb (110.2 kg)     Physical Exam  Constitutional: He appears well-developed and well-nourished.  HENT:  Head: Normocephalic and atraumatic.  Right Ear: Tympanic membrane and external ear normal. No decreased hearing is noted.  Left Ear: Tympanic membrane and external ear normal. No decreased hearing is noted.  Mouth/Throat: No oropharyngeal exudate or posterior oropharyngeal erythema.  Eyes: Pupils are equal, round, and reactive to light.  Neck: Normal range of motion. Neck supple.  Cardiovascular: Normal rate and regular rhythm.   No murmur heard. Pulmonary/Chest: Breath sounds normal. No respiratory distress.  Abdominal: Soft. Bowel sounds are normal. He exhibits no mass. There is no tenderness.  Musculoskeletal: Normal range of motion. He exhibits edema (2+ pretibial bilateral).  Neurological: He is alert.  Vitals reviewed.     Assessment & Plan:   Harve was seen today for edema.  Diagnoses and all orders for this visit:  Edema, unspecified type -     CMP14+EGFR  Essential hypertension  Other orders -     triamterene-hydrochlorothiazide (  MAXZIDE) 75-50 MG tablet; Take 1 tablet by mouth every morning. -     amLODipine (NORVASC) 5 MG tablet; Take 1 tablet (5 mg total) by mouth daily.   Valsartan discontinued    I have discontinued David Bartlett amLODipine, hydrochlorothiazide, valsartan, and varenicline. I am also having him start on triamterene-hydrochlorothiazide and amLODipine. Additionally, I am having him maintain his Cetirizine HCl, cholecalciferol, aspirin, traMADol, VENTOLIN HFA, and  atorvastatin.  Allergies as of 07/29/2017      Reactions   Penicillins       Medication List       Accurate as of 07/29/17  6:55 PM. Always use your most recent med list.          amLODipine 5 MG tablet Commonly known as:  NORVASC Take 1 tablet (5 mg total) by mouth daily.   aspirin 81 MG tablet Take 81 mg by mouth daily.   atorvastatin 20 MG tablet Commonly known as:  LIPITOR TAKE ONE (1) TABLET EACH DAY   Cetirizine HCl 10 MG Tbdp Take 1 tablet by mouth daily.   cholecalciferol 1000 units tablet Commonly known as:  VITAMIN D Take 1,000 Units by mouth daily.   traMADol 50 MG tablet Commonly known as:  ULTRAM Take one 2-4 times daily as needed for left hip pain.   triamterene-hydrochlorothiazide 75-50 MG tablet Commonly known as:  MAXZIDE Take 1 tablet by mouth every morning.   VENTOLIN HFA 108 (90 Base) MCG/ACT inhaler Generic drug:  albuterol USE 2 PUFFS EVERY 6 HOURS AS NEEDED        Follow-up: No Follow-up on file.  Claretta Fraise, M.D.

## 2017-07-30 LAB — CMP14+EGFR
A/G RATIO: 2 (ref 1.2–2.2)
ALT: 46 IU/L — AB (ref 0–44)
AST: 34 IU/L (ref 0–40)
Albumin: 4.9 g/dL (ref 3.5–5.5)
Alkaline Phosphatase: 86 IU/L (ref 39–117)
BILIRUBIN TOTAL: 0.4 mg/dL (ref 0.0–1.2)
BUN/Creatinine Ratio: 15 (ref 9–20)
BUN: 13 mg/dL (ref 6–24)
CALCIUM: 9.8 mg/dL (ref 8.7–10.2)
CHLORIDE: 100 mmol/L (ref 96–106)
CO2: 22 mmol/L (ref 20–29)
Creatinine, Ser: 0.85 mg/dL (ref 0.76–1.27)
GFR calc Af Amer: 120 mL/min/{1.73_m2} (ref >59)
GFR, EST NON AFRICAN AMERICAN: 104 mL/min/{1.73_m2} (ref >59)
GLOBULIN, TOTAL: 2.5 g/dL (ref 1.5–4.5)
Glucose: 112 mg/dL — ABNORMAL HIGH (ref 65–99)
POTASSIUM: 3.8 mmol/L (ref 3.5–5.2)
SODIUM: 140 mmol/L (ref 134–144)
TOTAL PROTEIN: 7.4 g/dL (ref 6.0–8.5)

## 2017-07-31 ENCOUNTER — Other Ambulatory Visit (HOSPITAL_COMMUNITY): Payer: Self-pay | Admitting: Emergency Medicine

## 2017-07-31 NOTE — Progress Notes (Addendum)
Surgical clearance Dr Livia Snellen ON CHART 06-20-17   LOV Dr Livia Snellen fam.med. 07-29-17 epic  He is having hip replacement surgery next Thursday and they won't proceed with the surgery if he has the swelling (...) I am also having him start on triamterene-hydrochlorothiazide and amLODipine.    ECHO/SRESS TEST 06-07-2011  "Stress echo results: Left ventricular ejection fraction was normal at rest and with stress. There was no echocardiographic evidence forstress-induced ischemia."  CMP 07-29-17 epic

## 2017-07-31 NOTE — Patient Instructions (Signed)
David Bartlett  07/31/2017   Your procedure is scheduled on: 08-07-17  Report to [facility] Main  Entrance Take [place]  elevators to 3rd floor to  [place] at [phone].   Call this number if you have problems the morning of surgery [phone]   Remember: ONLY 1 PERSON MAY GO WITH YOU TO SHORT STAY TO GET  READY MORNING OF [place].  Do not eat food or drink liquids :After Midnight.     Take these medicines the morning of surgery with A SIP OF WATER: amlodipine(norvasc), atorvastatin(lipitor), inhaler as needed (may bring to hospital)                                 You may not have any metal on your body including hair pins and              piercings  Do not wear jewelry, make-up, lotions, powders or perfumes, deodorant              Men may shave face and neck.   Do not bring valuables to the hospital. [place].  Contacts, dentures or bridgework may not be worn into surgery.  Leave suitcase in the car. After surgery it may be brought to your room.                 Please read over the following fact sheets you were given: _____________________________________________________________________             [facility] - Preparing for Surgery Before surgery, you can play an important role.  Because skin is not sterile, your skin needs to be as free of germs as possible.  You can reduce the number of germs on your skin by washing with CHG (chlorahexidine gluconate) soap before surgery.  CHG is an antiseptic cleaner which kills germs and bonds with the skin to continue killing germs even after washing. Please DO NOT use if you have an allergy to CHG or antibacterial soaps.  If your skin becomes reddened/irritated stop using the CHG and inform your nurse when you arrive at Short Stay. Do not shave (including legs and underarms) for at least 48 hours prior to the first CHG shower.  You may shave your  face/neck. Please follow these instructions carefully:  1.  Shower with CHG Soap the night before surgery and the  morning of Surgery.  2.  If you choose to wash your hair, wash your hair first as usual with your  normal  shampoo.  3.  After you shampoo, rinse your hair and body thoroughly to remove the  shampoo.                           4.  Use CHG as you would any other liquid soap.  You can apply chg directly  to the skin and wash                       Gently with a scrungie or clean washcloth.  5.  Apply the CHG Soap to your body ONLY FROM THE NECK DOWN.   Do not use on face/ open  Wound or open sores. Avoid contact with eyes, ears mouth and genitals (private parts).                       Wash face,  Genitals (private parts) with your normal soap.             6.  Wash thoroughly, paying special attention to the area where your surgery  will be performed.  7.  Thoroughly rinse your body with warm water from the neck down.  8.  DO NOT shower/wash with your normal soap after using and rinsing off  the CHG Soap.                9.  Pat yourself dry with a clean towel.            10.  Wear clean pajamas.            11.  Place clean sheets on your bed the night of your first shower and do not  sleep with pets. Day of Surgery : Do not apply any lotions/deodorants the morning of surgery.  Please wear clean clothes to the hospital/surgery center.  FAILURE TO FOLLOW THESE INSTRUCTIONS MAY RESULT IN THE CANCELLATION OF YOUR SURGERY PATIENT SIGNATURE_________________________________  NURSE SIGNATURE__________________________________  ________________________________________________________________________   David Bartlett  An incentive spirometer is a tool that can help keep your lungs clear and active. This tool measures how well you are filling your lungs with each breath. Taking long deep breaths may help reverse or decrease the chance of developing breathing  (pulmonary) problems (especially infection) following:  A long period of time when you are unable to move or be active. BEFORE THE PROCEDURE   If the spirometer includes an indicator to show your best effort, your nurse or respiratory therapist will set it to a desired goal.  If possible, sit up straight or lean slightly forward. Try not to slouch.  Hold the incentive spirometer in an upright position. INSTRUCTIONS FOR USE  1. Sit on the edge of your bed if possible, or sit up as far as you can in bed or on a chair. 2. Hold the incentive spirometer in an upright position. 3. Breathe out normally. 4. Place the mouthpiece in your mouth and seal your lips tightly around it. 5. Breathe in slowly and as deeply as possible, raising the piston or the ball toward the top of the column. 6. Hold your breath for 3-5 seconds or for as long as possible. Allow the piston or ball to fall to the bottom of the column. 7. Remove the mouthpiece from your mouth and breathe out normally. 8. Rest for a few seconds and repeat Steps 1 through 7 at least 10 times every 1-2 hours when you are awake. Take your time and take a few normal breaths between deep breaths. 9. The spirometer may include an indicator to show your best effort. Use the indicator as a goal to work toward during each repetition. 10. After each set of 10 deep breaths, practice coughing to be sure your lungs are clear. If you have an incision (the cut made at the time of surgery), support your incision when coughing by placing a pillow or rolled up towels firmly against it. Once you are able to get out of bed, walk around indoors and cough well. You may stop using the incentive spirometer when instructed by your caregiver.  RISKS AND COMPLICATIONS  Take your time so you do not get  dizzy or light-headed.  If you are in pain, you may need to take or ask for pain medication before doing incentive spirometry. It is harder to take a deep breath if you  are having pain. AFTER USE  Rest and breathe slowly and easily.  It can be helpful to keep track of a log of your progress. Your caregiver can provide you with a simple table to help with this. If you are using the spirometer at home, follow these instructions: [place] IF:   You are having difficultly using the spirometer.  You have trouble using the spirometer as often as instructed.  Your pain medication is not giving enough relief while using the spirometer.  You develop fever of 100.5 F (38.1 C) or higher. SEEK IMMEDIATE MEDICAL CARE IF:   You cough up bloody sputum that had not been present before.  You develop fever of 102 F (38.9 C) or greater.  You develop worsening pain at or near the incision site. MAKE SURE YOU:   Understand these instructions.  Will watch your condition.  Will get help right away if you are not doing well or get worse. Document Released: 04/14/2007 Document Revised: 02/24/2012 Document Reviewed: 06/15/2007 ExitCare Patient Information 2014 Exit[employer], [place].   ________________________________________________________________________  WHAT IS A BLOOD TRANSFUSION? Blood Transfusion Information  A transfusion is the replacement of blood or some of its parts. Blood is made up of multiple cells which provide different functions.  Red blood cells carry oxygen and are used for blood loss replacement.  White blood cells fight against infection.  Platelets control bleeding.  Plasma helps clot blood.  Other blood products are available for specialized needs, such as hemophilia or other clotting disorders. BEFORE THE TRANSFUSION  Who gives blood for transfusions?   Healthy volunteers who are fully evaluated to make sure their blood is safe. This is blood bank blood. Transfusion therapy is the safest it has ever been in the practice of medicine. Before blood is taken from a donor, a complete history is taken to make sure that person has  no history of diseases nor engages in risky social behavior (examples are intravenous drug use or sexual activity with multiple partners). The donor's travel history is screened to minimize risk of transmitting infections, such as malaria. The donated blood is tested for signs of infectious diseases, such as HIV and hepatitis. The blood is then tested to be sure it is compatible with you in order to minimize the chance of a transfusion reaction. If you or a relative donates blood, this is often done in anticipation of surgery and is not appropriate for emergency situations. It takes many days to process the donated blood. RISKS AND COMPLICATIONS Although transfusion therapy is very safe and saves many lives, the main dangers of transfusion include:   Getting an infectious disease.  Developing a transfusion reaction. This is an allergic reaction to something in the blood you were given. Every precaution is taken to prevent this. The decision to have a blood transfusion has been considered carefully by your caregiver before blood is given. Blood is not given unless the benefits outweigh the risks. AFTER THE TRANSFUSION  Right after receiving a blood transfusion, you will usually feel much better and more energetic. This is especially true if your red blood cells have gotten low (anemic). The transfusion raises the level of the red blood cells which carry oxygen, and this usually causes an energy increase.  The nurse administering the transfusion will  monitor you carefully for complications. HOME CARE INSTRUCTIONS  No special instructions are needed after a transfusion. You may find your energy is better. Speak with your caregiver about any limitations on activity for underlying diseases you may have. SEEK MEDICAL CARE IF:   Your condition is not improving after your transfusion.  You develop redness or irritation at the intravenous (IV) site. SEEK IMMEDIATE MEDICAL CARE IF:  Any of the following  symptoms occur over the next 12 hours:  Shaking chills.  You have a temperature by mouth above 102 F (38.9 C), not controlled by medicine.  Chest, back, or muscle pain.  People around you feel you are not acting correctly or are confused.  Shortness of breath or difficulty breathing.  Dizziness and fainting.  You get a rash or develop hives.  You have a decrease in urine output.  Your urine turns a dark color or changes to pink, red, or brown. Any of the following symptoms occur over the next 10 days:  You have a temperature by mouth above 102 F (38.9 C), not controlled by medicine.  Shortness of breath.  Weakness after normal activity.  The white part of the eye turns yellow (jaundice).  You have a decrease in the amount of urine or are urinating less often.  Your urine turns a dark color or changes to pink, red, or brown. Document Released: 11/29/2000 Document Revised: 02/24/2012 Document Reviewed: 07/18/2008 [facility] Patient Information 2014 [place], [place].  _______________________________________________________________________

## 2017-08-01 ENCOUNTER — Encounter (HOSPITAL_COMMUNITY): Payer: Self-pay

## 2017-08-01 ENCOUNTER — Encounter (HOSPITAL_COMMUNITY)
Admission: RE | Admit: 2017-08-01 | Discharge: 2017-08-01 | Disposition: A | Discharge status: Home / Self Care | Payer: BLUE CROSS/BLUE SHIELD | Source: Ambulatory Visit | Attending: Orthopedic Surgery | Admitting: Orthopedic Surgery

## 2017-08-01 DIAGNOSIS — M87052 Idiopathic aseptic necrosis of left femur: Secondary | ICD-10-CM | POA: Diagnosis not present

## 2017-08-01 DIAGNOSIS — Z01818 Encounter for other preprocedural examination: Secondary | ICD-10-CM | POA: Insufficient documentation

## 2017-08-01 DIAGNOSIS — M1612 Unilateral primary osteoarthritis, left hip: Secondary | ICD-10-CM | POA: Insufficient documentation

## 2017-08-01 DIAGNOSIS — R9431 Abnormal electrocardiogram [ECG] [EKG]: Secondary | ICD-10-CM | POA: Diagnosis not present

## 2017-08-01 DIAGNOSIS — Z01810 Encounter for preprocedural cardiovascular examination: Secondary | ICD-10-CM | POA: Insufficient documentation

## 2017-08-01 DIAGNOSIS — I10 Essential (primary) hypertension: Secondary | ICD-10-CM | POA: Insufficient documentation

## 2017-08-01 LAB — CBC
HCT: 41.4 % (ref 39.0–52.0)
HEMOGLOBIN: 14.6 g/dL (ref 13.0–17.0)
MCH: 30.8 pg (ref 26.0–34.0)
MCHC: 35.3 g/dL (ref 30.0–36.0)
MCV: 87.3 fL (ref 78.0–100.0)
Platelets: 252 10*3/uL (ref 150–400)
RBC: 4.74 MIL/uL (ref 4.22–5.81)
RDW: 11.9 % (ref 11.5–15.5)
WBC: 11.9 10*3/uL — AB (ref 4.0–10.5)

## 2017-08-01 LAB — SURGICAL PCR SCREEN
MRSA, PCR: NEGATIVE
Staphylococcus aureus: POSITIVE — AB

## 2017-08-01 LAB — ABO/RH: ABO/RH(D): O POS

## 2017-08-04 ENCOUNTER — Encounter: Payer: Self-pay | Admitting: Family Medicine

## 2017-08-04 ENCOUNTER — Ambulatory Visit (INDEPENDENT_AMBULATORY_CARE_PROVIDER_SITE_OTHER): Payer: BLUE CROSS/BLUE SHIELD | Admitting: Family Medicine

## 2017-08-04 VITALS — BP 169/91 | HR 89 | Ht 67.0 in | Wt 259.0 lb

## 2017-08-04 DIAGNOSIS — I10 Essential (primary) hypertension: Secondary | ICD-10-CM | POA: Diagnosis not present

## 2017-08-04 DIAGNOSIS — M25552 Pain in left hip: Secondary | ICD-10-CM

## 2017-08-04 MED ORDER — CLONIDINE HCL 0.1 MG PO TABS: 0.1000 mg | ORAL_TABLET | Freq: Two times a day (BID) | ORAL | 2 refills | Status: DC

## 2017-08-04 NOTE — Progress Notes (Signed)
Subjective:  Patient ID: David Bartlett, male    DOB: 10-21-1971  Age: 46 y.o. MRN: [id number]  CC: Blood Pressure Check (pt here today for BP check and his scheduled for this Thursday and has already been cleared)   HPI David Bartlett presents for  follow-up of hypertension. Patient has no history of headache chest pain or shortness of breath or recent cough. Patient also denies symptoms of TIA such as numbness weakness lateralizing. Patient checks  blood pressureWith multiple elevated readings both at home and in his orthopedist's office and at his preop recently. Patient denies side effects from medication other than the swelling. The swelling has improved. States taking it regularly.    History David Bartlett has a past medical history of Abnormal EKG; Asthmatic bronchitis; HTN (hypertension); Left breast mass; and Smoker.   He has a past surgical history that includes No past surgeries.   His family history includes Cancer in his father; Heart attack in his father and mother; Kidney disease in his mother.He reports that he quit smoking about 2 months ago. His smoking use included Cigarettes. He has a 10.00 pack-year smoking history. He has never used smokeless tobacco. He reports that he drinks about 1.8 oz of alcohol per week . He reports that he does not use drugs.  Current Outpatient Prescriptions on File Prior to Visit  Medication Sig Dispense Refill  . amLODipine (NORVASC) 5 MG tablet Take 1 tablet (5 mg total) by mouth daily. 90 tablet 3  . atorvastatin (LIPITOR) 20 MG tablet TAKE ONE (1) TABLET EACH DAY 90 tablet 1  . Cetirizine HCl 10 MG TBDP Take 1 tablet by mouth daily.    . cholecalciferol (VITAMIN D) 1000 units tablet Take 1,000 Units by mouth daily.    Marland Kitchen triamterene-hydrochlorothiazide (MAXZIDE) 75-50 MG tablet Take 1 tablet by mouth every morning. 30 tablet 0  . VENTOLIN HFA 108 (90 Base) MCG/ACT inhaler USE 2 PUFFS EVERY 6 HOURS AS NEEDED 18 g 5  . aspirin 81 MG tablet Take  81 mg by mouth daily.     No current facility-administered medications on file prior to visit.     ROS Review of Systems  Constitutional: Negative for chills, diaphoresis and fever.  HENT: Negative for rhinorrhea and sore throat.   Respiratory: Negative for cough and shortness of breath.   Cardiovascular: Positive for leg swelling. Negative for chest pain.  Gastrointestinal: Negative for abdominal pain.  Musculoskeletal: Negative for arthralgias and myalgias.  Skin: Negative for rash.  Neurological: Negative for weakness and headaches.    Objective:  BP (!) 169/91   Pulse 89   Ht 5\' 7"  (1.702 m)   Wt 259 lb (117.5 kg)   BMI 40.57 kg/m   BP Readings from Last 3 Encounters:  08/04/17 (!) 169/91  08/01/17 (!) 158/103  07/29/17 (!) 149/88    Wt Readings from Last 3 Encounters:  08/04/17 259 lb (117.5 kg)  08/01/17 254 lb 6.4 oz (115.4 kg)  07/29/17 257 lb (116.6 kg)     Physical Exam  Constitutional: He is oriented to person, place, and time. He appears well-developed and well-nourished.  HENT:  Head: Normocephalic and atraumatic.  Right Ear: Tympanic membrane and external ear normal. No decreased hearing is noted.  Left Ear: Tympanic membrane and external ear normal. No decreased hearing is noted.  Mouth/Throat: No oropharyngeal exudate or posterior oropharyngeal erythema.  Eyes: Pupils are equal, round, and reactive to light.  Neck: Normal range of motion. Neck supple.  Cardiovascular: Normal rate and regular rhythm.   No murmur heard. Pulmonary/Chest: Breath sounds normal. No respiratory distress.  Abdominal: Soft. Bowel sounds are normal. He exhibits no mass. There is no tenderness.  Musculoskeletal: He exhibits tenderness (left hip).  Neurological: He is alert and oriented to person, place, and time.  Psychiatric: He has a normal mood and affect. His behavior is normal. Thought content normal.  Vitals reviewed.    Lab Results  Component Value Date   WBC  11.9 (H) 08/01/2017   HGB 14.6 08/01/2017   HCT 41.4 08/01/2017   PLT 252 08/01/2017   GLUCOSE 112 (H) 07/29/2017   CHOL 125 06/17/2016   TRIG 448 (H) 06/17/2016   HDL 28 (L) 06/17/2016   LDLCALC Comment 06/17/2016   ALT 46 (H) 07/29/2017   AST 34 07/29/2017   NA 140 07/29/2017   K 3.8 07/29/2017   CL 100 07/29/2017   CREATININE 0.85 07/29/2017   BUN 13 07/29/2017   CO2 22 07/29/2017   TSH 1.410 12/30/2015   PSA 0.2 10/28/2014   HGBA1C 5.7 (H) 12/30/2015     Assessment & Plan:   David Bartlett was seen today for blood pressure check.  Diagnoses and all orders for this visit:  Essential hypertension  Left hip pain  Morbid obesity due to excess calories (David Bartlett)  Other orders -     cloNIDine (CATAPRES) 0.1 MG tablet; Take 1 tablet (0.1 mg total) by mouth 2 (two) times daily.   I am having David Bartlett start on cloNIDine. I am also having him maintain his Cetirizine HCl, cholecalciferol, aspirin, VENTOLIN HFA, atorvastatin, triamterene-hydrochlorothiazide, and amLODipine.  Meds ordered this encounter  Medications  . cloNIDine (CATAPRES) 0.1 MG tablet    Sig: Take 1 tablet (0.1 mg total) by mouth 2 (two) times daily.    Dispense:  60 tablet    Refill:  2    We discussed weight loss. The patient feels that once he is recovered from his upcoming orthopedic surgery for the knee that he will be able to lose a significant amount of weight. He is hoping to lose 5-10 pounds a month and get down into his high school weight of 165. Patient cleared for surgery. Anesthesia has done preop and cleared him. I made it clear to him that he will need to let them know about the clonidine preop. And he was warned thoroughly regarding the risk for rebound hypertension    Follow-up: Return in about 6 weeks (around 09/15/2017).  Claretta Fraise, M.D.

## 2017-08-04 NOTE — Progress Notes (Signed)
Final EKG with  abnormality; RN took to anesthesia and had Dr Hassie Bruce review, EKG, [place] cardiology and PCP progress notes , and ECHO/Stress results. Per [place], okay for surgery; no further instructions or recommendations received.

## 2017-08-06 DIAGNOSIS — M25571 Pain in right ankle and joints of right foot: Secondary | ICD-10-CM | POA: Diagnosis not present

## 2017-08-06 DIAGNOSIS — M7989 Other specified soft tissue disorders: Secondary | ICD-10-CM | POA: Diagnosis not present

## 2017-08-07 ENCOUNTER — Inpatient Hospital Stay (HOSPITAL_COMMUNITY): Payer: BLUE CROSS/BLUE SHIELD | Admitting: Anesthesiology

## 2017-08-07 ENCOUNTER — Inpatient Hospital Stay (HOSPITAL_COMMUNITY): Payer: BLUE CROSS/BLUE SHIELD

## 2017-08-07 ENCOUNTER — Inpatient Hospital Stay (HOSPITAL_COMMUNITY)
Admission: RE | Admit: 2017-08-07 | Discharge: 2017-08-08 | DRG: 470 | Disposition: A | Discharge status: Home / Self Care | Payer: BLUE CROSS/BLUE SHIELD | Attending: Orthopedic Surgery | Admitting: Orthopedic Surgery

## 2017-08-07 ENCOUNTER — Encounter (HOSPITAL_COMMUNITY): Payer: Self-pay | Admitting: *Deleted

## 2017-08-07 ENCOUNTER — Encounter (HOSPITAL_COMMUNITY)
Admission: RE | Disposition: A | Discharge status: Home / Self Care | Payer: Self-pay | Source: Home / Self Care | Attending: Orthopedic Surgery

## 2017-08-07 DIAGNOSIS — E785 Hyperlipidemia, unspecified: Secondary | ICD-10-CM | POA: Diagnosis present

## 2017-08-07 DIAGNOSIS — I10 Essential (primary) hypertension: Secondary | ICD-10-CM | POA: Diagnosis present

## 2017-08-07 DIAGNOSIS — Z8249 Family history of ischemic heart disease and other diseases of the circulatory system: Secondary | ICD-10-CM | POA: Diagnosis not present

## 2017-08-07 DIAGNOSIS — J45909 Unspecified asthma, uncomplicated: Secondary | ICD-10-CM | POA: Diagnosis present

## 2017-08-07 DIAGNOSIS — Z96642 Presence of left artificial hip joint: Secondary | ICD-10-CM | POA: Diagnosis not present

## 2017-08-07 DIAGNOSIS — Z87891 Personal history of nicotine dependence: Secondary | ICD-10-CM | POA: Diagnosis not present

## 2017-08-07 DIAGNOSIS — M25752 Osteophyte, left hip: Secondary | ICD-10-CM | POA: Diagnosis present

## 2017-08-07 DIAGNOSIS — Z6841 Body Mass Index (BMI) 40.0 and over, adult: Secondary | ICD-10-CM

## 2017-08-07 DIAGNOSIS — M87052 Idiopathic aseptic necrosis of left femur: Secondary | ICD-10-CM | POA: Diagnosis present

## 2017-08-07 DIAGNOSIS — M1612 Unilateral primary osteoarthritis, left hip: Secondary | ICD-10-CM | POA: Diagnosis not present

## 2017-08-07 DIAGNOSIS — Z09 Encounter for follow-up examination after completed treatment for conditions other than malignant neoplasm: Secondary | ICD-10-CM

## 2017-08-07 DIAGNOSIS — Z880 Allergy status to penicillin: Secondary | ICD-10-CM

## 2017-08-07 DIAGNOSIS — N529 Male erectile dysfunction, unspecified: Secondary | ICD-10-CM | POA: Diagnosis not present

## 2017-08-07 DIAGNOSIS — Z471 Aftercare following joint replacement surgery: Secondary | ICD-10-CM | POA: Diagnosis not present

## 2017-08-07 DIAGNOSIS — M25552 Pain in left hip: Secondary | ICD-10-CM | POA: Diagnosis not present

## 2017-08-07 DIAGNOSIS — Z419 Encounter for procedure for purposes other than remedying health state, unspecified: Secondary | ICD-10-CM

## 2017-08-07 HISTORY — PX: TOTAL HIP ARTHROPLASTY: SHX124

## 2017-08-07 LAB — TYPE AND SCREEN
ABO/RH(D): O POS
ANTIBODY SCREEN: NEGATIVE

## 2017-08-07 SURGERY — ARTHROPLASTY, HIP, TOTAL, ANTERIOR APPROACH
Anesthesia: General | Site: Hip | Laterality: Left

## 2017-08-07 MED ORDER — SUCCINYLCHOLINE CHLORIDE 200 MG/10ML IV SOSY
PREFILLED_SYRINGE | INTRAVENOUS | Status: AC
  Filled 2017-08-07: qty 10

## 2017-08-07 MED ORDER — ALBUTEROL SULFATE HFA 108 (90 BASE) MCG/ACT IN AERS
INHALATION_SPRAY | RESPIRATORY_TRACT | Status: DC | PRN
  Administered 2017-08-07: 4 via RESPIRATORY_TRACT

## 2017-08-07 MED ORDER — ACETAMINOPHEN 10 MG/ML IV SOLN
1000.0000 mg | INTRAVENOUS | Status: AC
  Administered 2017-08-07: 1000 mg via INTRAVENOUS
  Filled 2017-08-07: qty 100

## 2017-08-07 MED ORDER — BUPIVACAINE-EPINEPHRINE (PF) 0.25% -1:200000 IJ SOLN
INTRAMUSCULAR | Status: AC
  Filled 2017-08-07: qty 30

## 2017-08-07 MED ORDER — MIDAZOLAM HCL 5 MG/5ML IJ SOLN
INTRAMUSCULAR | Status: DC | PRN
  Administered 2017-08-07: 2 mg via INTRAVENOUS

## 2017-08-07 MED ORDER — ISOPROPYL ALCOHOL 70 % SOLN
Status: AC
  Filled 2017-08-07: qty 480

## 2017-08-07 MED ORDER — ALBUTEROL SULFATE HFA 108 (90 BASE) MCG/ACT IN AERS: 2.0000 | INHALATION_SPRAY | Freq: Four times a day (QID) | RESPIRATORY_TRACT | Status: DC | PRN

## 2017-08-07 MED ORDER — CHLORHEXIDINE GLUCONATE 4 % EX LIQD: 60.0000 mL | Freq: Once | CUTANEOUS | Status: DC

## 2017-08-07 MED ORDER — SODIUM CHLORIDE 0.9 % IJ SOLN
INTRAMUSCULAR | Status: AC
  Filled 2017-08-07: qty 50

## 2017-08-07 MED ORDER — FENTANYL CITRATE (PF) 100 MCG/2ML IJ SOLN
INTRAMUSCULAR | Status: DC | PRN
  Administered 2017-08-07: 50 ug via INTRAVENOUS
  Administered 2017-08-07: 100 ug via INTRAVENOUS
  Administered 2017-08-07 (×4): 50 ug via INTRAVENOUS

## 2017-08-07 MED ORDER — ROCURONIUM BROMIDE 50 MG/5ML IV SOSY
PREFILLED_SYRINGE | INTRAVENOUS | Status: AC
  Filled 2017-08-07: qty 5

## 2017-08-07 MED ORDER — AMLODIPINE BESYLATE 5 MG PO TABS
5.0000 mg | ORAL_TABLET | Freq: Every day | ORAL | Status: DC
  Filled 2017-08-07: qty 1

## 2017-08-07 MED ORDER — BUPIVACAINE-EPINEPHRINE 0.25% -1:200000 IJ SOLN
INTRAMUSCULAR | Status: DC | PRN
  Administered 2017-08-07: 30 mL

## 2017-08-07 MED ORDER — SENNA 8.6 MG PO TABS
2.0000 | ORAL_TABLET | Freq: Every day | ORAL | Status: DC
  Administered 2017-08-07: 17.2 mg via ORAL
  Filled 2017-08-07: qty 2

## 2017-08-07 MED ORDER — ALBUTEROL SULFATE (2.5 MG/3ML) 0.083% IN NEBU: 2.5000 mg | INHALATION_SOLUTION | Freq: Four times a day (QID) | RESPIRATORY_TRACT | Status: DC | PRN

## 2017-08-07 MED ORDER — DOCUSATE SODIUM 100 MG PO CAPS
100.0000 mg | ORAL_CAPSULE | Freq: Two times a day (BID) | ORAL | Status: DC
  Administered 2017-08-07: 100 mg via ORAL
  Filled 2017-08-07: qty 1

## 2017-08-07 MED ORDER — VITAMIN D3 25 MCG (1000 UNIT) PO TABS
1000.0000 [IU] | ORAL_TABLET | Freq: Every day | ORAL | Status: DC
  Administered 2017-08-08: 1000 [IU] via ORAL
  Filled 2017-08-07: qty 1

## 2017-08-07 MED ORDER — FENTANYL CITRATE (PF) 250 MCG/5ML IJ SOLN
INTRAMUSCULAR | Status: AC
  Filled 2017-08-07: qty 5

## 2017-08-07 MED ORDER — SODIUM CHLORIDE 0.9 % IV SOLN: INTRAVENOUS | Status: DC

## 2017-08-07 MED ORDER — TRIAMTERENE-HCTZ 75-50 MG PO TABS
1.0000 | ORAL_TABLET | Freq: Every day | ORAL | Status: DC
  Administered 2017-08-08: 1 via ORAL
  Filled 2017-08-07: qty 1

## 2017-08-07 MED ORDER — DEXAMETHASONE SODIUM PHOSPHATE 10 MG/ML IJ SOLN
INTRAMUSCULAR | Status: DC | PRN
  Administered 2017-08-07: 10 mg via INTRAVENOUS

## 2017-08-07 MED ORDER — LACTATED RINGERS IV SOLN
INTRAVENOUS | Status: DC
  Administered 2017-08-07 (×2): via INTRAVENOUS
  Administered 2017-08-07: 1000 mL via INTRAVENOUS
  Administered 2017-08-07: 16:00:00 via INTRAVENOUS

## 2017-08-07 MED ORDER — ONDANSETRON HCL 4 MG/2ML IJ SOLN
INTRAMUSCULAR | Status: DC | PRN
  Administered 2017-08-07: 4 mg via INTRAVENOUS

## 2017-08-07 MED ORDER — ROCURONIUM BROMIDE 50 MG/5ML IV SOSY
PREFILLED_SYRINGE | INTRAVENOUS | Status: DC | PRN
  Administered 2017-08-07: 20 mg via INTRAVENOUS
  Administered 2017-08-07: 50 mg via INTRAVENOUS

## 2017-08-07 MED ORDER — PHENOL 1.4 % MT LIQD: 1.0000 | OROMUCOSAL | Status: DC | PRN

## 2017-08-07 MED ORDER — LORATADINE 10 MG PO TABS
10.0000 mg | ORAL_TABLET | Freq: Every day | ORAL | Status: DC
  Administered 2017-08-08: 10 mg via ORAL
  Filled 2017-08-07: qty 1

## 2017-08-07 MED ORDER — CLINDAMYCIN PHOSPHATE 900 MG/50ML IV SOLN
900.0000 mg | INTRAVENOUS | Status: AC
  Administered 2017-08-07: 900 mg via INTRAVENOUS
  Filled 2017-08-07: qty 50

## 2017-08-07 MED ORDER — ACETAMINOPHEN 650 MG RE SUPP: 650.0000 mg | Freq: Four times a day (QID) | RECTAL | Status: DC | PRN

## 2017-08-07 MED ORDER — SUGAMMADEX SODIUM 200 MG/2ML IV SOLN
INTRAVENOUS | Status: DC | PRN
  Administered 2017-08-07: 200 mg via INTRAVENOUS

## 2017-08-07 MED ORDER — FENTANYL CITRATE (PF) 100 MCG/2ML IJ SOLN
INTRAMUSCULAR | Status: AC
  Filled 2017-08-07: qty 2

## 2017-08-07 MED ORDER — SODIUM CHLORIDE 0.9 % IR SOLN
Status: DC | PRN
  Administered 2017-08-07 (×2): 1000 mL

## 2017-08-07 MED ORDER — BUPIVACAINE HCL (PF) 0.5 % IJ SOLN
INTRAMUSCULAR | Status: AC
  Filled 2017-08-07: qty 30

## 2017-08-07 MED ORDER — KETOROLAC TROMETHAMINE 30 MG/ML IJ SOLN
INTRAMUSCULAR | Status: AC
  Filled 2017-08-07: qty 1

## 2017-08-07 MED ORDER — METOCLOPRAMIDE HCL 5 MG/ML IJ SOLN: 5.0000 mg | Freq: Three times a day (TID) | INTRAMUSCULAR | Status: DC | PRN

## 2017-08-07 MED ORDER — KETOROLAC TROMETHAMINE 30 MG/ML IJ SOLN
INTRAMUSCULAR | Status: DC | PRN
Start: 2017-08-07 — End: 2017-08-07
  Administered 2017-08-07: 30 mg

## 2017-08-07 MED ORDER — HYDROMORPHONE HCL-NACL 0.5-0.9 MG/ML-% IV SOSY
PREFILLED_SYRINGE | INTRAVENOUS | Status: AC
  Filled 2017-08-07: qty 2

## 2017-08-07 MED ORDER — DEXAMETHASONE SODIUM PHOSPHATE 10 MG/ML IJ SOLN
INTRAMUSCULAR | Status: AC
  Filled 2017-08-07: qty 1

## 2017-08-07 MED ORDER — PROPOFOL 10 MG/ML IV BOLUS
INTRAVENOUS | Status: AC
  Filled 2017-08-07: qty 60

## 2017-08-07 MED ORDER — HYDROMORPHONE HCL-NACL 0.5-0.9 MG/ML-% IV SOSY: 0.5000 mg | PREFILLED_SYRINGE | INTRAVENOUS | Status: DC | PRN

## 2017-08-07 MED ORDER — SUGAMMADEX SODIUM 200 MG/2ML IV SOLN
INTRAVENOUS | Status: AC
  Filled 2017-08-07: qty 2

## 2017-08-07 MED ORDER — DEXAMETHASONE SODIUM PHOSPHATE 10 MG/ML IJ SOLN
10.0000 mg | Freq: Once | INTRAMUSCULAR | Status: AC
  Administered 2017-08-08: 10 mg via INTRAVENOUS
  Filled 2017-08-07: qty 1

## 2017-08-07 MED ORDER — SUCCINYLCHOLINE CHLORIDE 200 MG/10ML IV SOSY
PREFILLED_SYRINGE | INTRAVENOUS | Status: DC | PRN
  Administered 2017-08-07: 120 mg via INTRAVENOUS

## 2017-08-07 MED ORDER — CLONIDINE HCL 0.1 MG PO TABS
0.1000 mg | ORAL_TABLET | Freq: Two times a day (BID) | ORAL | Status: DC
  Administered 2017-08-07 – 2017-08-08 (×2): 0.1 mg via ORAL
  Filled 2017-08-07 (×2): qty 1

## 2017-08-07 MED ORDER — LIDOCAINE 2% (20 MG/ML) 5 ML SYRINGE
INTRAMUSCULAR | Status: DC | PRN
  Administered 2017-08-07: 100 mg via INTRAVENOUS

## 2017-08-07 MED ORDER — ONDANSETRON HCL 4 MG/2ML IJ SOLN
INTRAMUSCULAR | Status: AC
  Filled 2017-08-07: qty 2

## 2017-08-07 MED ORDER — POVIDONE-IODINE 10 % EX SWAB: 2.0000 "application " | Freq: Once | CUTANEOUS | Status: DC

## 2017-08-07 MED ORDER — KETOROLAC TROMETHAMINE 15 MG/ML IJ SOLN
15.0000 mg | Freq: Four times a day (QID) | INTRAMUSCULAR | Status: DC
  Administered 2017-08-07 – 2017-08-08 (×3): 15 mg via INTRAVENOUS
  Filled 2017-08-07 (×3): qty 1

## 2017-08-07 MED ORDER — ATORVASTATIN CALCIUM 20 MG PO TABS
20.0000 mg | ORAL_TABLET | Freq: Every day | ORAL | Status: DC
  Administered 2017-08-07: 20 mg via ORAL
  Filled 2017-08-07: qty 1

## 2017-08-07 MED ORDER — ONDANSETRON HCL 4 MG PO TABS: 4.0000 mg | ORAL_TABLET | Freq: Four times a day (QID) | ORAL | Status: DC | PRN

## 2017-08-07 MED ORDER — WATER FOR IRRIGATION, STERILE IR SOLN
Status: DC | PRN
  Administered 2017-08-07: 2000 mL

## 2017-08-07 MED ORDER — ALBUTEROL SULFATE HFA 108 (90 BASE) MCG/ACT IN AERS
INHALATION_SPRAY | RESPIRATORY_TRACT | Status: AC
  Filled 2017-08-07: qty 6.7

## 2017-08-07 MED ORDER — ISOPROPYL ALCOHOL 70 % SOLN
Status: DC | PRN
  Administered 2017-08-07: 1 via TOPICAL

## 2017-08-07 MED ORDER — TRANEXAMIC ACID 1000 MG/10ML IV SOLN
1000.0000 mg | Freq: Once | INTRAVENOUS | Status: AC
  Administered 2017-08-07: 1000 mg via INTRAVENOUS
  Filled 2017-08-07: qty 1100

## 2017-08-07 MED ORDER — METOCLOPRAMIDE HCL 5 MG PO TABS: 5.0000 mg | ORAL_TABLET | Freq: Three times a day (TID) | ORAL | Status: DC | PRN

## 2017-08-07 MED ORDER — HYDROMORPHONE HCL-NACL 0.5-0.9 MG/ML-% IV SOSY
0.2500 mg | PREFILLED_SYRINGE | INTRAVENOUS | Status: DC | PRN
  Administered 2017-08-07 (×2): 0.5 mg via INTRAVENOUS

## 2017-08-07 MED ORDER — ACETAMINOPHEN 325 MG PO TABS: 650.0000 mg | ORAL_TABLET | Freq: Four times a day (QID) | ORAL | Status: DC | PRN

## 2017-08-07 MED ORDER — TRANEXAMIC ACID 1000 MG/10ML IV SOLN
1000.0000 mg | INTRAVENOUS | Status: AC
  Administered 2017-08-07: 1000 mg via INTRAVENOUS
  Filled 2017-08-07: qty 1100

## 2017-08-07 MED ORDER — HYDROCODONE-ACETAMINOPHEN 5-325 MG PO TABS
1.0000 | ORAL_TABLET | ORAL | Status: DC | PRN
  Administered 2017-08-07 – 2017-08-08 (×5): 2 via ORAL
  Filled 2017-08-07 (×5): qty 2

## 2017-08-07 MED ORDER — LIDOCAINE 2% (20 MG/ML) 5 ML SYRINGE
INTRAMUSCULAR | Status: AC
  Filled 2017-08-07: qty 5

## 2017-08-07 MED ORDER — MIDAZOLAM HCL 2 MG/2ML IJ SOLN
INTRAMUSCULAR | Status: AC
  Filled 2017-08-07: qty 2

## 2017-08-07 MED ORDER — PROPOFOL 10 MG/ML IV BOLUS
INTRAVENOUS | Status: DC | PRN
  Administered 2017-08-07: 200 mg via INTRAVENOUS

## 2017-08-07 MED ORDER — CLINDAMYCIN PHOSPHATE 600 MG/50ML IV SOLN
600.0000 mg | Freq: Four times a day (QID) | INTRAVENOUS | Status: AC
  Administered 2017-08-07 – 2017-08-08 (×2): 600 mg via INTRAVENOUS
  Filled 2017-08-07 (×2): qty 50

## 2017-08-07 MED ORDER — POLYETHYLENE GLYCOL 3350 17 G PO PACK: 17.0000 g | PACK | Freq: Every day | ORAL | Status: DC | PRN

## 2017-08-07 MED ORDER — ONDANSETRON HCL 4 MG/2ML IJ SOLN
4.0000 mg | Freq: Four times a day (QID) | INTRAMUSCULAR | Status: DC | PRN
  Administered 2017-08-08: 4 mg via INTRAVENOUS
  Filled 2017-08-07: qty 2

## 2017-08-07 MED ORDER — METHOCARBAMOL 1000 MG/10ML IJ SOLN
500.0000 mg | Freq: Four times a day (QID) | INTRAVENOUS | Status: DC | PRN
  Administered 2017-08-07: 500 mg via INTRAVENOUS
  Filled 2017-08-07: qty 550

## 2017-08-07 MED ORDER — OXYCODONE HCL 5 MG PO TABS: 5.0000 mg | ORAL_TABLET | Freq: Once | ORAL | Status: DC | PRN

## 2017-08-07 MED ORDER — MENTHOL 3 MG MT LOZG: 1.0000 | LOZENGE | OROMUCOSAL | Status: DC | PRN

## 2017-08-07 MED ORDER — METHOCARBAMOL 500 MG PO TABS
500.0000 mg | ORAL_TABLET | Freq: Four times a day (QID) | ORAL | Status: DC | PRN
  Administered 2017-08-07: 500 mg via ORAL
  Filled 2017-08-07: qty 1

## 2017-08-07 MED ORDER — DIPHENHYDRAMINE HCL 12.5 MG/5ML PO ELIX: 12.5000 mg | ORAL_SOLUTION | ORAL | Status: DC | PRN

## 2017-08-07 MED ORDER — SODIUM CHLORIDE 0.9 % IJ SOLN
INTRAMUSCULAR | Status: DC | PRN
  Administered 2017-08-07: 30 mL

## 2017-08-07 MED ORDER — ASPIRIN 81 MG PO CHEW
81.0000 mg | CHEWABLE_TABLET | Freq: Two times a day (BID) | ORAL | Status: DC
  Administered 2017-08-08: 81 mg via ORAL
  Filled 2017-08-07: qty 1

## 2017-08-07 MED ORDER — OXYCODONE HCL 5 MG/5ML PO SOLN: 5.0000 mg | Freq: Once | ORAL | Status: DC | PRN

## 2017-08-07 SURGICAL SUPPLY — 44 items
ADH SKN CLS APL DERMABOND .7 (GAUZE/BANDAGES/DRESSINGS) ×1
BAG DECANTER FOR FLEXI CONT (MISCELLANEOUS) ×2 IMPLANT
BAG SPEC THK2 15X12 ZIP CLS (MISCELLANEOUS)
BAG ZIPLOCK 12X15 (MISCELLANEOUS) IMPLANT
CAPT HIP TOTAL 2 ×2 IMPLANT
CHLORAPREP W/TINT 26ML (MISCELLANEOUS) ×2 IMPLANT
CLOTH BEACON ORANGE TIMEOUT ST (SAFETY) ×2 IMPLANT
COVER PERINEAL POST (MISCELLANEOUS) ×2 IMPLANT
COVER SURGICAL LIGHT HANDLE (MISCELLANEOUS) ×2 IMPLANT
DECANTER SPIKE VIAL GLASS SM (MISCELLANEOUS) ×2 IMPLANT
DERMABOND ADVANCED (GAUZE/BANDAGES/DRESSINGS) ×1
DERMABOND ADVANCED .7 DNX12 (GAUZE/BANDAGES/DRESSINGS) ×1 IMPLANT
DRAPE SHEET LG 3/4 BI-LAMINATE (DRAPES) ×6 IMPLANT
DRAPE STERI IOBAN 125X83 (DRAPES) ×2 IMPLANT
DRAPE U-SHAPE 47X51 STRL (DRAPES) ×4 IMPLANT
DRSG AQUACEL AG ADV 3.5X10 (GAUZE/BANDAGES/DRESSINGS) ×2 IMPLANT
ELECT PENCIL ROCKER SW 15FT (MISCELLANEOUS) ×2 IMPLANT
ELECT REM PT RETURN 15FT ADLT (MISCELLANEOUS) ×2 IMPLANT
GAUZE SPONGE 4X4 12PLY STRL (GAUZE/BANDAGES/DRESSINGS) ×2 IMPLANT
GLOVE BIO SURGEON STRL SZ8.5 (GLOVE) ×4 IMPLANT
GLOVE BIOGEL PI IND STRL 8.5 (GLOVE) ×1 IMPLANT
GLOVE BIOGEL PI INDICATOR 8.5 (GLOVE) ×1
GOWN SPEC L3 XXLG W/TWL (GOWN DISPOSABLE) ×2 IMPLANT
HANDPIECE INTERPULSE COAX TIP (DISPOSABLE) ×2
HOLDER FOLEY CATH W/STRAP (MISCELLANEOUS) ×2 IMPLANT
HOOD PEEL AWAY FLYTE STAYCOOL (MISCELLANEOUS) ×4 IMPLANT
MARKER SKIN DUAL TIP RULER LAB (MISCELLANEOUS) ×2 IMPLANT
NEEDLE SPNL 18GX3.5 QUINCKE PK (NEEDLE) ×2 IMPLANT
PACK ANTERIOR HIP CUSTOM (KITS) ×2 IMPLANT
SAW OSC TIP CART 19.5X105X1.3 (SAW) ×2 IMPLANT
SEALER BIPOLAR AQUA 6.0 (INSTRUMENTS) ×2 IMPLANT
SET HNDPC FAN SPRY TIP SCT (DISPOSABLE) ×1 IMPLANT
SUT ETHIBOND NAB CT1 #1 30IN (SUTURE) ×6 IMPLANT
SUT MNCRL AB 3-0 PS2 18 (SUTURE) ×2 IMPLANT
SUT MON AB 2-0 CT1 36 (SUTURE) ×4 IMPLANT
SUT STRATAFIX PDO 1 14 VIOLET (SUTURE) ×2
SUT STRATFX PDO 1 14 VIOLET (SUTURE) ×1
SUT VIC AB 2-0 CT1 27 (SUTURE) ×2
SUT VIC AB 2-0 CT1 TAPERPNT 27 (SUTURE) ×1 IMPLANT
SUTURE STRATFX PDO 1 14 VIOLET (SUTURE) ×1 IMPLANT
SYR 50ML LL SCALE MARK (SYRINGE) ×2 IMPLANT
TRAY FOLEY W/METER SILVER 16FR (SET/KITS/TRAYS/PACK) ×2 IMPLANT
WATER STERILE IRR 1500ML POUR (IV SOLUTION) ×4 IMPLANT
YANKAUER SUCT BULB TIP 10FT TU (MISCELLANEOUS) ×2 IMPLANT

## 2017-08-07 NOTE — Interval H&P Note (Signed)
History and Physical Interval Note:  08/07/2017 12:48 PM  David Bartlett  has presented today for surgery, with the diagnosis of left hip avascular necrosis  The various methods of treatment have been discussed with the patient and family. After consideration of risks, benefits and other options for treatment, the patient has consented to  Procedure(s) with comments: LEFT TOTAL HIP ARTHROPLASTY ANTERIOR APPROACH (Left) - Needs RNFA as a surgical intervention .  The patient's history has been reviewed, patient examined, no change in status, stable for surgery.  I have reviewed the patient's chart and labs.  Questions were answered to the patient's satisfaction.     David Bartlett, Horald Pollen

## 2017-08-07 NOTE — H&P (View-Only) (Signed)
TOTAL HIP ADMISSION H&P  Patient is admitted for left total hip arthroplasty.  Subjective:  Chief Complaint: left hip pain  HPI: David Bartlett, 46 y.o. male, has a history of pain and functional disability in the left hip(s) due to AVN and patient has failed non-surgical conservative treatments for greater than 12 weeks to include flexibility and strengthening excercises, use of assistive devices, weight reduction as appropriate and activity modification.  Onset of symptoms was gradual starting 3 years ago with gradually worsening course since that time.The patient noted no past surgery on the left hip(s).  Patient currently rates pain in the left hip at 10 out of 10 with activity. Patient has night pain, worsening of pain with activity and weight bearing, pain that interfers with activities of daily living and pain with passive range of motion. Patient has evidence of AVN with collapse by imaging studies. This condition presents safety issues increasing the risk of falls. There is no current active infection.  Patient Active Problem List   Diagnosis Date Noted  . Avascular necrosis of bone of hip, left ([place]) 06/20/2017  . Erectile dysfunction 12/29/2015  . Loss of libido 12/29/2015  . Hypertension 05/17/2011  . Hyperlipidemia 05/17/2011  . Tobacco abuse 05/17/2011   Past Medical History:  Diagnosis Date  . Abnormal EKG   . Asthmatic bronchitis   . HTN (hypertension)   . Left breast mass   . Smoker     No past surgical history on file.   (Not in a hospital admission) Allergies  Allergen Reactions  . Penicillins     Social History  Substance Use Topics  . Smoking status: Former Smoker    Packs/day: 0.50    Years: 20.00    Types: Cigarettes    Quit date: 05/21/2017  . Smokeless tobacco: Never Used  . Alcohol use 1.8 oz/week    3 Cans of beer per week    Family History  Problem Relation Age of Onset  . Kidney disease Mother   . Heart attack Mother   . Cancer Father   .  Heart attack Father      Review of Systems  Constitutional: Negative.   HENT: Negative.   Eyes: Negative.   Respiratory: Negative.   Cardiovascular: Positive for leg swelling.  Gastrointestinal: Negative.   Genitourinary: Negative.   Musculoskeletal: Positive for joint pain.  Skin: Negative.   Neurological: Negative.   Endo/Heme/Allergies: Negative.   Psychiatric/Behavioral: Negative.     Objective:  Physical Exam  Vitals reviewed. Constitutional: He is oriented to person, place, and time. He appears well-developed and well-nourished.  HENT:  Head: Normocephalic and atraumatic.  Eyes: Pupils are equal, round, and reactive to light. Conjunctivae and EOM are normal.  Neck: Normal range of motion. Neck supple.  Cardiovascular: Normal rate.   Respiratory: Effort normal and breath sounds normal. No respiratory distress.  GI: Soft. He exhibits no distension.  Genitourinary:  Genitourinary Comments: deferred  Musculoskeletal: He exhibits edema.       Left hip: He exhibits decreased range of motion, bony tenderness and swelling.  Neurological: He is alert and oriented to person, place, and time. He has normal reflexes.  Skin: Skin is warm and dry.  Psychiatric: He has a normal mood and affect. His behavior is normal. Judgment and thought content normal.    Vital signs in last 24 hours: @VSRANGES @  Labs:   Estimated body mass index is 39.63 kg/m as calculated from the following:   Height as of 06/20/17:  5\' 7"  (1.702 m).   Weight as of 06/20/17: 114.8 kg (253 lb).   Imaging Review Plain radiographs demonstrate mild degenerative joint disease of the left hip(s). The bone quality appears to be adequate for age and reported activity level. AVN with collapse.  Assessment/Plan:  End stage arthritis, left hip(s)  The patient history, physical examination, clinical judgement of the provider and imaging studies are consistent with end stage degenerative joint disease of the left  hip(s) and total hip arthroplasty is deemed medically necessary. The treatment options including medical management, injection therapy, arthroscopy and arthroplasty were discussed at length. The risks and benefits of total hip arthroplasty were presented and reviewed. The risks due to aseptic loosening, infection, stiffness, dislocation/subluxation,  thromboembolic complications and other imponderables were discussed.  The patient acknowledged the explanation, agreed to proceed with the plan and consent was signed. Patient is being admitted for inpatient treatment for surgery, pain control, PT, OT, prophylactic antibiotics, VTE prophylaxis, progressive ambulation and ADL's and discharge planning.The patient is planning to be discharged home with HEP

## 2017-08-07 NOTE — Op Note (Signed)
OPERATIVE REPORT  SURGEON: Rod Can, MD   ASSISTANT: Staff.  PREOPERATIVE DIAGNOSIS: Avascular necrosis Left hip.   POSTOPERATIVE DIAGNOSIS: Avascular necrosis Left hip.   PROCEDURE: Left total hip arthroplasty, anterior approach.   IMPLANTS: DePuy Tri Lock stem, size 4, standard offset. DePuy Pinnacle Cup, size 54 mm. DePuy Altrx liner, size 32 by 54 mm, neutral. DePuy Biolox ceramic head ball, size 32 + 1 mm.  ANESTHESIA:  General  ESTIMATED BLOOD LOSS: 300 mL.  ANTIBIOTICS: 900 mg clindamycin.  DRAINS: None.  COMPLICATIONS: None.   CONDITION: PACU - hemodynamically stable.   BRIEF CLINICAL NOTE: David Bartlett is a 46 y.o. male with a long-standing history of Left hip avascular necrosis. After failing conservative management, the patient was indicated for total hip arthroplasty. The risks, benefits, and alternatives to the procedure were explained, and the patient elected to proceed.  PROCEDURE IN DETAIL: Surgical site was marked by myself in the pre-op holding area. Once inside the operating room, spinal anesthesia was obtained, and a foley catheter was inserted. The patient was then positioned on the Hana table. All bony prominences were well padded. The hip was prepped and draped in the normal sterile surgical fashion. A time-out was called verifying side and site of surgery. The patient received IV antibiotics within 60 minutes of beginning the procedure.  The direct anterior approach to the hip was performed through the Hueter interval. Lateral femoral circumflex vessels were treated with the Auqumantys. The anterior capsule was exposed and an inverted T capsulotomy was made.The femoral neck cut was made to the level of the templated cut. A corkscrew was placed into the head and the head was removed. The femoral head was found to have delaminated cartilage. The head was passed to the back table and was measured.  Acetabular exposure was achieved,  and the pulvinar and labrum were excised. Sequential reaming of the acetabulum was then performed up to a size 53 mm reamer. A 54 mm cup was then opened and impacted into place at approximately 40 degrees of abduction and 20 degrees of anteversion. The final polyethylene liner was impacted into place and acetabular osteophytes were removed.   I then gained femoral exposure taking care to protect the abductors and greater trochanter. This was performed using standard external rotation, extension, and adduction. The capsule was peeled off the inner aspect of the greater trochanter, taking care to preserve the short external rotators. A cookie cutter was used to enter the femoral canal, and then the femoral canal finder was placed. Sequential broaching was performed up to a size 4. Calcar planer was used on the femoral neck remnant. I placed a standard offset neck and a trial head ball. The hip was reduced. Leg lengths and offset were checked fluoroscopically. The hip was dislocated and trial components were removed. The final implants were placed, and the hip was reduced.  Fluoroscopy was used to confirm component position and leg lengths. At 90 degrees of external rotation and full extension, the hip was stable to an anterior directed force.  The wound was copiously irrigated with normal saline using pulse lavage. Marcaine solution was injected into the periarticular soft tissue. The wound was closed in layers using #1 Vicryl and Stratafix for the fascia, 2-0 Vicryl for the subcutaneous fat, 2-0 Monocryl for the deep dermal layer, 3-0 running Monocryl subcuticular stitch, and Dermabond for the skin. Once the glue was fully dried, an Aquacell Ag dressing was applied. The patient was transported to the recovery room  in stable condition. Sponge, needle, and instrument counts were correct at the end of the case x2. The patient tolerated the procedure well and there were no known complications.

## 2017-08-07 NOTE — Anesthesia Procedure Notes (Signed)
Procedure Name: Intubation Date/Time: 08/07/2017 1:23 PM Performed by: Montel Clock Pre-anesthesia Checklist: Patient identified, Emergency Drugs available, Suction available, Patient being monitored and Timeout performed Patient Re-evaluated:Patient Re-evaluated prior to induction Oxygen Delivery Method: Circle system utilized Preoxygenation: Pre-oxygenation with 100% oxygen Induction Type: IV induction and Rapid sequence Laryngoscope Size: Glidescope and 4 Grade View: Grade I Tube type: Oral Tube size: 7.5 mm Number of attempts: 1 Airway Equipment and Method: Rigid stylet and Video-laryngoscopy Placement Confirmation: ETT inserted through vocal cords under direct vision,  positive ETCO2 and breath sounds checked- equal and bilateral Secured at: 23 cm Tube secured with: Tape Dental Injury: Teeth and Oropharynx as per pre-operative assessment  Comments: Elective glidescope r/t beard and body habitus. Grade 1 view with Glidescope 4 blade, required some manipulation of ETT to reach glottis (anterior appearing). VSS.

## 2017-08-07 NOTE — Anesthesia Preprocedure Evaluation (Addendum)
Anesthesia Evaluation  Patient identified by MRN, date of birth, ID band Patient awake    Reviewed: Allergy & Precautions, NPO status , Patient's Chart, lab work & pertinent test results  Airway Mallampati: III  TM Distance: <3 FB Neck ROM: Full   Comment: Beard and class #3 Dental  (+) Teeth Intact   Pulmonary asthma , former smoker,   Mild end exp wheeze  breath sounds clear to auscultation       Cardiovascular hypertension,  Rhythm:Regular Rate:Normal     Neuro/Psych    GI/Hepatic negative GI ROS, Neg liver ROS,   Endo/Other  Morbid obesity  Renal/GU negative Renal ROS     Musculoskeletal   Abdominal (+) + obese,   Peds  Hematology   Anesthesia Other Findings   Reproductive/Obstetrics                            Anesthesia Physical Anesthesia Plan  ASA: III  Anesthesia Plan: General   Post-op Pain Management:    Induction: Intravenous  PONV Risk Score and Plan: 3 and Ondansetron, Dexamethasone, Midazolam and Propofol infusion  Airway Management Planned: Oral ETT and Video Laryngoscope Planned  Additional Equipment:   Intra-op Plan:   Post-operative Plan: Extubation in OR  Informed Consent: I have reviewed the patients History and Physical, chart, labs and discussed the procedure including the risks, benefits and alternatives for the proposed anesthesia with the patient or authorized representative who has indicated his/her understanding and acceptance.   Dental advisory given  Plan Discussed with: CRNA  Anesthesia Plan Comments:         Anesthesia Quick Evaluation

## 2017-08-07 NOTE — Anesthesia Postprocedure Evaluation (Signed)
Anesthesia Post Note  Patient: David Bartlett  Procedure(s) Performed: Procedure(s) (LRB): LEFT TOTAL HIP ARTHROPLASTY ANTERIOR APPROACH (Left)     Patient location during evaluation: PACU Anesthesia Type: General Level of consciousness: awake and alert and oriented Pain management: pain level controlled Vital Signs Assessment: post-procedure vital signs reviewed and stable Respiratory status: spontaneous breathing, nonlabored ventilation, respiratory function stable and patient connected to nasal cannula oxygen Cardiovascular status: blood pressure returned to baseline and stable Postop Assessment: no signs of nausea or vomiting Anesthetic complications: no    Last Vitals:  Vitals:   08/07/17 1645 08/07/17 1700  BP: 140/89 (!) 161/91  Pulse: 90 91  Resp: 16 14  Temp:    SpO2: 94% 96%    Last Pain:  Vitals:   08/07/17 1700  TempSrc:   PainSc: 4     LLE Motor Response: Responds to commands (08/07/17 1700) LLE Sensation: No numbness;No tingling (08/07/17 1700) RLE Motor Response: Responds to commands (08/07/17 1700) RLE Sensation: No numbness;No tingling (08/07/17 1700) L Sensory Level: S1-Sole of foot, small toes (08/07/17 1700) R Sensory Level: S1-Sole of foot, small toes (08/07/17 1700)  Jazman Reuter A.

## 2017-08-07 NOTE — Discharge Instructions (Signed)
°Dr. Azile Minardi °Joint Replacement Specialist °[facility] °[address] °[place], [place] [address] °[phone] ° ° °TOTAL HIP REPLACEMENT POSTOPERATIVE DIRECTIONS ° ° ° °Hip Rehabilitation, Guidelines Following Surgery  ° °WEIGHT BEARING °Weight bearing as tolerated with assist device (walker, cane, etc) as directed, use it as long as suggested by your surgeon or therapist, typically at least 4-6 weeks. ° °The results of a hip operation are greatly improved after range of motion and muscle strengthening exercises. Follow all safety measures which are given to protect your hip. If any of these exercises cause increased pain or swelling in your joint, decrease the amount until you are comfortable again. Then slowly increase the exercises. Call your caregiver if you have problems or questions.  ° °HOME CARE INSTRUCTIONS  °Most of the following instructions are designed to prevent the dislocation of your new hip.  °Remove items at home which could result in a fall. This includes throw rugs or furniture in walking pathways.  °Continue medications as instructed at time of discharge. °· You may have some home medications which will be placed on hold until you complete the course of blood thinner medication. °· You may start showering once you are discharged home. Do not remove your dressing. °Do not put on socks or shoes without following the instructions of your caregivers.   °Sit on chairs with arms. Use the chair arms to help push yourself up when arising.  °Arrange for the use of a toilet seat elevator so you are not sitting low.  °· Walk with walker as instructed.  °You may resume a sexual relationship in one month or when given the OK by your caregiver.  °Use walker as long as suggested by your caregivers.  °You may put full weight on your legs and walk as much as is comfortable. °Avoid periods of inactivity such as sitting longer than an hour when not asleep. This helps prevent  blood clots.  °You may return to work once you are cleared by your surgeon.  °Do not drive a car for 6 weeks or until released by your surgeon.  °Do not drive while taking narcotics.  °Wear elastic stockings for two weeks following surgery during the day but you may remove then at night.  °Make sure you keep all of your appointments after your operation with all of your doctors and caregivers. You should call the office at the above phone number and make an appointment for approximately two weeks after the date of your surgery. °Please pick up a stool softener and laxative for home use as long as you are requiring pain medications. °· ICE to the affected hip every three hours for 30 minutes at a time and then as needed for pain and swelling. Continue to use ice on the hip for pain and swelling from surgery. You may notice swelling that will progress down to the foot and ankle.  This is normal after surgery.  Elevate the leg when you are not up walking on it.   °It is important for you to complete the blood thinner medication as prescribed by your doctor. °· Continue to use the breathing machine which will help keep your temperature down.  It is common for your temperature to cycle up and down following surgery, especially at night when you are not up moving around and exerting yourself.  The breathing machine keeps your lungs expanded and your temperature down. ° °RANGE OF MOTION AND STRENGTHENING EXERCISES  °These exercises are   designed to help you keep full movement of your hip joint. Follow your caregiver's or physical therapist's instructions. Perform all exercises about fifteen times, three times per day or as directed. Exercise both hips, even if you have had only one joint replacement. These exercises can be done on a training (exercise) mat, on the floor, on a table or on a bed. Use whatever works the best and is most comfortable for you. Use music or television while you are exercising so that the exercises  are a pleasant break in your day. This will make your life better with the exercises acting as a break in routine you can look forward to.  °Lying on your back, slowly slide your foot toward your buttocks, raising your knee up off the floor. Then slowly slide your foot back down until your leg is straight again.  °Lying on your back spread your legs as far apart as you can without causing discomfort.  °Lying on your side, raise your upper leg and foot straight up from the floor as far as is comfortable. Slowly lower the leg and repeat.  °Lying on your back, tighten up the muscle in the front of your thigh (quadriceps muscles). You can do this by keeping your leg straight and trying to raise your heel off the floor. This helps strengthen the largest muscle supporting your knee.  °Lying on your back, tighten up the muscles of your buttocks both with the legs straight and with the knee bent at a comfortable angle while keeping your heel on the floor.  ° °SKILLED REHAB INSTRUCTIONS: °If the patient is transferred to a skilled rehab facility following release from the hospital, a list of the current medications will be sent to the facility for the patient to continue.  When discharged from the skilled rehab facility, please have the facility set up the patient's Home Health Physical Therapy prior to being released. Also, the skilled facility will be responsible for providing the patient with their medications at time of release from the facility to include their pain medication and their blood thinner medication. If the patient is still at the rehab facility at time of the two week follow up appointment, the skilled rehab facility will also need to assist the patient in arranging follow up appointment in our office and any transportation needs. ° °MAKE SURE YOU:  °Understand these instructions.  °Will watch your condition.  °Will get help right away if you are not doing well or get worse. ° °Pick up stool softner and  laxative for home use following surgery while on pain medications. °Do not remove your dressing. °The dressing is waterproof--it is OK to take showers. °Continue to use ice for pain and swelling after surgery. °Do not use any lotions or creams on the incision until instructed by your surgeon. °Total Hip Protocol. ° ° °

## 2017-08-07 NOTE — Transfer of Care (Signed)
Immediate Anesthesia Transfer of Care Note  Patient: David Bartlett  Procedure(s) Performed: Procedure(s) with comments: LEFT TOTAL HIP ARTHROPLASTY ANTERIOR APPROACH (Left) - Needs RNFA  Patient Location: PACU  Anesthesia Type:General  Level of Consciousness:  sedated, patient cooperative and responds to stimulation  Airway & Oxygen Therapy:Patient Spontanous Breathing and Patient connected to face mask oxgen  Post-op Assessment:  Report given to PACU RN and Post -op Vital signs reviewed and stable  Post vital signs:  Reviewed and stable  Last Vitals:  Vitals:   08/07/17 1021 08/07/17 1605  BP: (!) 148/88   Pulse: 77 100  Resp: 16 15  Temp: 36.7 C   SpO2: 27% 51%    Complications: No apparent anesthesia complications

## 2017-08-08 ENCOUNTER — Encounter (HOSPITAL_COMMUNITY): Payer: Self-pay | Admitting: Orthopedic Surgery

## 2017-08-08 LAB — CBC
HEMATOCRIT: 33.8 % — AB (ref 39.0–52.0)
HEMOGLOBIN: 11.7 g/dL — AB (ref 13.0–17.0)
MCH: 30.6 pg (ref 26.0–34.0)
MCHC: 34.6 g/dL (ref 30.0–36.0)
MCV: 88.5 fL (ref 78.0–100.0)
Platelets: 195 10*3/uL (ref 150–400)
RBC: 3.82 MIL/uL — AB (ref 4.22–5.81)
RDW: 11.9 % (ref 11.5–15.5)
WBC: 14.2 10*3/uL — ABNORMAL HIGH (ref 4.0–10.5)

## 2017-08-08 LAB — BASIC METABOLIC PANEL
Anion gap: 7 (ref 5–15)
BUN: 18 mg/dL (ref 6–20)
CHLORIDE: 100 mmol/L — AB (ref 101–111)
CO2: 25 mmol/L (ref 22–32)
Calcium: 8.2 mg/dL — ABNORMAL LOW (ref 8.9–10.3)
Creatinine, Ser: 1.16 mg/dL (ref 0.61–1.24)
GFR calc Af Amer: >60 mL/min (ref >60)
GFR calc non Af Amer: >60 mL/min (ref >60)
GLUCOSE: 311 mg/dL — AB (ref 65–99)
POTASSIUM: 4.6 mmol/L (ref 3.5–5.1)
SODIUM: 132 mmol/L — AB (ref 135–145)

## 2017-08-08 MED ORDER — HYDROCODONE-ACETAMINOPHEN 5-325 MG PO TABS: 1.0000 | ORAL_TABLET | ORAL | 0 refills | Status: DC | PRN

## 2017-08-08 MED ORDER — ASPIRIN 81 MG PO CHEW: 81.0000 mg | CHEWABLE_TABLET | Freq: Two times a day (BID) | ORAL | 1 refills | Status: DC

## 2017-08-08 MED ORDER — METHOCARBAMOL 500 MG PO TABS
500.0000 mg | ORAL_TABLET | Freq: Four times a day (QID) | ORAL | 0 refills | Status: AC | PRN
Start: 2017-08-08 — End: 2017-08-15

## 2017-08-08 MED ORDER — ONDANSETRON HCL 4 MG PO TABS: 4.0000 mg | ORAL_TABLET | Freq: Four times a day (QID) | ORAL | 0 refills | Status: DC | PRN

## 2017-08-08 MED ORDER — SENNA 8.6 MG PO TABS: 2.0000 | ORAL_TABLET | Freq: Every day | ORAL | 0 refills | Status: DC

## 2017-08-08 MED ORDER — DOCUSATE SODIUM 100 MG PO CAPS: 100.0000 mg | ORAL_CAPSULE | Freq: Two times a day (BID) | ORAL | 1 refills | Status: DC

## 2017-08-08 NOTE — Progress Notes (Signed)
Discharge planning, no HH needs identified except needs RW. Contacted [facility] to deliver to the room. [phone]

## 2017-08-08 NOTE — Progress Notes (Signed)
   Subjective:  Patient reports pain as mild to moderate.  Denies N/V/CP/SOB.  Objective:   VITALS:   Vitals:   08/08/17 0206 08/08/17 0533 08/08/17 0813 08/08/17 0917  BP: 117/72 129/81 137/88   Pulse: 77 73    Resp: 16 16    Temp: 97.9 F (36.6 C) 97.7 F (36.5 C)  97.8 F (36.6 C)  TempSrc: Oral Oral    SpO2: 97% 97%    Weight:      Height:        NAD ABD soft Sensation intact distally Intact pulses distally Dorsiflexion/Plantar flexion intact Incision: dressing C/D/I Compartment soft   Lab Results  Component Value Date   WBC 14.2 (H) 08/08/2017   HGB 11.7 (L) 08/08/2017   HCT 33.8 (L) 08/08/2017   MCV 88.5 08/08/2017   PLT 195 08/08/2017   BMET    Component Value Date/Time   NA 132 (L) 08/08/2017 0613   NA 140 07/29/2017 1622   K 4.6 08/08/2017 0613   CL 100 (L) 08/08/2017 0613   CO2 25 08/08/2017 0613   GLUCOSE 311 (H) 08/08/2017 0613   BUN 18 08/08/2017 0613   BUN 13 07/29/2017 1622   CREATININE 1.16 08/08/2017 0613   CALCIUM 8.2 (L) 08/08/2017 0613   GFRNONAA >60 08/08/2017 0613   GFRAA >60 08/08/2017 8115     Assessment/Plan: 1 Day Post-Op   Principal Problem:   Avascular necrosis of bone of hip, left ([facility]) Active Problems:   Avascular necrosis of hip, left ([facility])   WBAT with walker ASA, SCDs, TEDS PO pain control PT/OT Dispo: d/c home with HEP   Antion Andres, Horald Pollen 08/08/2017, 10:03 AM   Rod Can, MD Cell [phone]

## 2017-08-08 NOTE — Discharge Summary (Signed)
Physician Discharge Summary  Patient ID: KUNAAL WALKINS MRN: [id number] DOB/AGE: 07-17-1971 46 y.o.  Admit date: 08/07/2017 Discharge date: 08/08/2017  Admission Diagnoses:  Avascular necrosis of bone of hip, left [facility])  Discharge Diagnoses:  Principal Problem:   Avascular necrosis of bone of hip, left ([place]) Active Problems:   Avascular necrosis of hip, left [facility])   Past Medical History:  Diagnosis Date  . Abnormal EKG   . Asthmatic bronchitis   . HTN (hypertension)   . Left breast mass    resolved on its own   . Smoker     Surgeries: Procedure(s): LEFT TOTAL HIP ARTHROPLASTY ANTERIOR APPROACH on 08/07/2017   Consultants (if any):   Discharged Condition: Improved  Hospital Course: JOTHAM AHN is an 46 y.o. male who was admitted 08/07/2017 with a diagnosis of Avascular necrosis of bone of hip, left ([place]) and went to the operating room on 08/07/2017 and underwent the above named procedures.    He was given perioperative antibiotics:  Anti-infectives    Start     Dose/Rate Route Frequency Ordered Stop   08/07/17 2000  clindamycin (CLEOCIN) IVPB 600 mg     600 mg 100 mL/hr over 30 Minutes Intravenous Every 6 hours 08/07/17 1737 08/08/17 0245   08/07/17 1018  clindamycin (CLEOCIN) IVPB 900 mg     900 mg 100 mL/hr over 30 Minutes Intravenous On call to O.R. 08/07/17 1018 08/07/17 1354    .  He was given sequential compression devices, early ambulation, and ASA for DVT prophylaxis.  He benefited maximally from the hospital stay and there were no complications.    Recent vital signs:  Vitals:   08/08/17 0813 08/08/17 0917  BP: 137/88   Pulse:    Resp:    Temp:  97.8 F (36.6 C)  SpO2:      Recent laboratory studies:  Lab Results  Component Value Date   HGB 11.7 (L) 08/08/2017   HGB 14.6 08/01/2017   HGB WILL FOLLOW 12/30/2015   HGB 15.3 12/30/2015   Lab Results  Component Value Date   WBC 14.2 (H) 08/08/2017   PLT 195 08/08/2017   No results found  for: INR Lab Results  Component Value Date   NA 132 (L) 08/08/2017   K 4.6 08/08/2017   CL 100 (L) 08/08/2017   CO2 25 08/08/2017   BUN 18 08/08/2017   CREATININE 1.16 08/08/2017   GLUCOSE 311 (H) 08/08/2017    Discharge Medications:   Allergies as of 08/08/2017      Reactions   Penicillins Other (See Comments)   Under arms swell      Medication List    STOP taking these medications   aspirin 81 MG tablet Replaced by:  aspirin 81 MG chewable tablet     TAKE these medications   amLODipine 5 MG tablet Commonly known as:  NORVASC Take 1 tablet (5 mg total) by mouth daily.   aspirin 81 MG chewable tablet Chew 1 tablet (81 mg total) by mouth 2 (two) times daily. Replaces:  aspirin 81 MG tablet   atorvastatin 20 MG tablet Commonly known as:  LIPITOR TAKE ONE (1) TABLET EACH DAY   Cetirizine HCl 10 MG Tbdp Take 1 tablet by mouth daily.   cholecalciferol 1000 units tablet Commonly known as:  VITAMIN D Take 1,000 Units by mouth daily.   cloNIDine 0.1 MG tablet Commonly known as:  CATAPRES Take 1 tablet (0.1 mg total) by mouth 2 (two) times daily.  docusate sodium 100 MG capsule Commonly known as:  COLACE Take 1 capsule (100 mg total) by mouth 2 (two) times daily.   HYDROcodone-acetaminophen 5-325 MG tablet Commonly known as:  NORCO/VICODIN Take 1-2 tablets by mouth every 4 (four) hours as needed (breakthrough pain).   methocarbamol 500 MG tablet Commonly known as:  ROBAXIN Take 1 tablet (500 mg total) by mouth every 6 (six) hours as needed for muscle spasms.   ondansetron 4 MG tablet Commonly known as:  ZOFRAN Take 1 tablet (4 mg total) by mouth every 6 (six) hours as needed for nausea.   senna 8.6 MG Tabs tablet Commonly known as:  SENOKOT Take 2 tablets (17.2 mg total) by mouth at bedtime.   triamterene-hydrochlorothiazide 75-50 MG tablet Commonly known as:  MAXZIDE Take 1 tablet by mouth every morning.   VENTOLIN HFA 108 (90 Base) MCG/ACT  inhaler Generic drug:  albuterol USE 2 PUFFS EVERY 6 HOURS AS NEEDED            Durable Medical Equipment        Start     Ordered   08/07/17 1738  DME Walker rolling  Once    Question:  Patient needs a walker to treat with the following condition  Answer:  Status post total hip replacement, left   08/07/17 1737       Discharge Care Instructions        Start     Ordered   08/08/17 0000  aspirin 81 MG chewable tablet  2 times daily     08/08/17 1006   08/08/17 0000  docusate sodium (COLACE) 100 MG capsule  2 times daily     08/08/17 1006   08/08/17 0000  HYDROcodone-acetaminophen (NORCO/VICODIN) 5-325 MG tablet  Every 4 hours PRN     08/08/17 1006   08/08/17 0000  methocarbamol (ROBAXIN) 500 MG tablet  Every 6 hours PRN     08/08/17 1006   08/08/17 0000  ondansetron (ZOFRAN) 4 MG tablet  Every 6 hours PRN     08/08/17 1006   08/08/17 0000  senna (SENOKOT) 8.6 MG TABS tablet  Daily at bedtime     08/08/17 1006   08/08/17 0000  Call MD / Call 911    Comments:  If you experience chest pain or shortness of breath, CALL 911 and be transported to the hospital emergency room.  If you develope a fever above 101 F, pus (white drainage) or increased drainage or redness at the wound, or calf pain, call your surgeon's office.   08/08/17 1006   08/08/17 0000  Diet - low sodium heart healthy     08/08/17 1006   08/08/17 0000  Constipation Prevention    Comments:  Drink plenty of fluids.  Prune juice may be helpful.  You may use a stool softener, such as Colace (over the counter) 100 mg twice a day.  Use MiraLax (over the counter) for constipation as needed.   08/08/17 1006   08/08/17 0000  Increase activity slowly as tolerated     08/08/17 1006   08/08/17 0000  TED hose    Comments:  Use stockings (TED hose) for 2 weeks on both leg(s).  You may remove them at night for sleeping.   08/08/17 1006   08/08/17 0000  Lifting restrictions    Comments:  No lifting for 6 weeks   08/08/17  1006   08/08/17 0000  Driving restrictions    Comments:  No driving for 6  weeks   08/08/17 1006      Diagnostic Studies: Dg Pelvis Portable  Result Date: 08/07/2017 CLINICAL DATA:  Left total hip arthroplasty. EXAM: PORTABLE PELVIS 1-2 VIEWS COMPARISON:  Pelvic x-rays dated March 18, 2017. FINDINGS: Postsurgical changes related to interval left total hip arthroplasty. Components are well aligned. No hardware failure or loosening. The right hip joint space is preserved. No focal bony abnormality. Bone mineralization is normal. Expected subcutaneous emphysema about the left hip IMPRESSION: Postsurgical changes related to left total hip arthroplasty without evidence of acute postoperative complication. Electronically Signed   By: Titus Dubin M.D.   On: 08/07/2017 16:29   Dg C-arm 61-120 Min-no Report  Result Date: 08/07/2017 Fluoroscopy was utilized by the requesting physician.  No radiographic interpretation.   Dg Hip Operative Unilat With Pelvis Left  Result Date: 08/07/2017 CLINICAL DATA:  Left total hip arthroplasty. EXAM: DG C-ARM 61-120 MIN-NO REPORT; OPERATIVE LEFT HIP WITH PELVIS CONTRAST:  None FLUOROSCOPY TIME:  Fluoroscopy Time:  18 seconds Number of Acquired Spot Images: 0 COMPARISON:  None. FINDINGS: Hardware components of a left hip arthroplasty device are identified. The components are in anatomic alignment. No periprosthetic fracture or subluxation identified. IMPRESSION: 1. No complications status post left hip arthroplasty. Electronically Signed   By: Kerby Moors M.D.   On: 08/07/2017 15:57    Disposition:   Discharge Instructions    Call MD / Call 911    Complete by:  As directed    If you experience chest pain or shortness of breath, CALL 911 and be transported to the hospital emergency room.  If you develope a fever above 101 F, pus (white drainage) or increased drainage or redness at the wound, or calf pain, call your surgeon's office.   Constipation Prevention     Complete by:  As directed    Drink plenty of fluids.  Prune juice may be helpful.  You may use a stool softener, such as Colace (over the counter) 100 mg twice a day.  Use MiraLax (over the counter) for constipation as needed.   Diet - low sodium heart healthy    Complete by:  As directed    Driving restrictions    Complete by:  As directed    No driving for 6 weeks   Increase activity slowly as tolerated    Complete by:  As directed    Lifting restrictions    Complete by:  As directed    No lifting for 6 weeks   TED hose    Complete by:  As directed    Use stockings (TED hose) for 2 weeks on both leg(s).  You may remove them at night for sleeping.      Follow-up Information    Sherryn Pollino, Aaron Edelman, MD. Schedule an appointment as soon as possible for a visit in 2 weeks.   Specialty:  Orthopedic Surgery Why:  For wound re-check Contact information: [address] [address] [phone]            Signed: Elie Goody 08/08/2017, 10:06 AM

## 2017-08-08 NOTE — Evaluation (Signed)
Physical Therapy Evaluation Patient Details Name: David Bartlett MRN: [id number] DOB: [birth year] Today's Date: 08/08/2017   History of Present Illness  Pt s/p L THR  Clinical Impression  Pt s/p L THR and presents with decreased L LE strength/ROM and post op pain limiting functional mobility.  Pt should progress to dc home with family assist.    Follow Up Recommendations DC plan and follow up therapy as arranged by surgeon    Equipment Recommendations  Rolling walker with 5" wheels    Recommendations for Other Services       Precautions / Restrictions Precautions Precautions: Fall Restrictions Weight Bearing Restrictions: No Other Position/Activity Restrictions: WBAT      Mobility  Bed Mobility Overal bed mobility: Needs Assistance Bed Mobility: Supine to Sit;Sit to Supine     Supine to sit: Min guard Sit to supine: Min guard   General bed mobility comments: cues for sequence and use of R LE to self assist  Transfers Overall transfer level: Needs assistance Equipment used: Rolling walker (2 wheeled) Transfers: Sit to/from Stand Sit to Stand: Min guard         General transfer comment: cues for LE management and use of UEs to self assist  Ambulation/Gait Ambulation/Gait assistance: Min guard Ambulation Distance (Feet): 150 Feet Assistive device: Rolling walker (2 wheeled) Gait Pattern/deviations: Step-to pattern;Step-through pattern;Decreased step length - right;Decreased step length - left;Shuffle;Trunk flexed Gait velocity: decr Gait velocity interpretation: Below normal speed for age/gender General Gait Details: cues for posture, position from RW and initial sequence  Stairs            Wheelchair Mobility    Modified Rankin (Stroke Patients Only)       Balance Overall balance assessment: No apparent balance deficits (not formally assessed)                                           Pertinent Vitals/Pain Pain Assessment:  0-10 Pain Score: 3  Pain Location: L hip Pain Descriptors / Indicators: Aching;Sore Pain Intervention(s): Limited activity within patient's tolerance;Monitored during session;Premedicated before session;Ice applied    Home Living Family/patient expects to be discharged to:: Private residence Living Arrangements: Spouse/significant other Available Help at Discharge: Family Type of Home: House Home Access: Level entry     Home Layout: One level Home Equipment: Toilet riser      Prior Function Level of Independence: Independent               Hand Dominance        Extremity/Trunk Assessment   Upper Extremity Assessment Upper Extremity Assessment: Overall WFL for tasks assessed    Lower Extremity Assessment Lower Extremity Assessment: LLE deficits/detail LLE Deficits / Details: Strength at hip 2+/5 with AAROM at hip to 85 flex and 15 abd    Cervical / Trunk Assessment Cervical / Trunk Assessment: Normal  Communication   Communication: No difficulties  Cognition Arousal/Alertness: Awake/alert Behavior During Therapy: WFL for tasks assessed/performed Overall Cognitive Status: Within Functional Limits for tasks assessed                                        General Comments      Exercises Total Joint Exercises Ankle Circles/Pumps: AROM;Both;20 reps;Supine Quad Sets: AROM;Both;10 reps;Supine Heel Slides: AAROM;Left;20 reps;Supine Hip ABduction/ADduction:  AAROM;Left;15 reps;Supine   Assessment/Plan    PT Assessment Patient needs continued PT services  PT Problem List Decreased strength;Decreased range of motion;Decreased activity tolerance;Decreased mobility;Decreased knowledge of use of DME;Pain;Obesity       PT Treatment Interventions DME instruction;Gait training;Functional mobility training;Therapeutic activities;Therapeutic exercise;Patient/family education    PT Goals (Current goals can be found in the Care Plan section)  Acute  Rehab PT Goals Patient Stated Goal: Regain 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 : A Little Difficulty sitting down on and standing up from a chair with arms (e.g., wheelchair, bedside commode, etc,.)?: A Little Help needed moving to and from a bed to chair (including a wheelchair)?: A Little Help needed walking in hospital room?: A Little Help needed climbing 3-5 steps with a railing? : A Little 6 Click Score: 17    End of Session Equipment Utilized During Treatment: Gait belt Activity Tolerance: Patient tolerated treatment well Patient left: in chair Nurse Communication: Mobility status PT Visit Diagnosis: Difficulty in walking, not elsewhere classified (R26.2)    Time: 2094-7096 PT Time Calculation (min) (ACUTE ONLY): 24 min   Charges:   PT Evaluation $PT Eval Low Complexity: 1 Low PT Treatments $Therapeutic Exercise: 8-22 mins   PT G Codes:        Pg [phone]   Zahriyah Joo 08/08/2017, 12:44 PM

## 2017-08-08 NOTE — Progress Notes (Signed)
Physical Therapy Treatment Patient Details Name: BRICK KETCHER MRN: [id number] DOB: 1971/11/21 Today's Date: 08/08/2017    History of Present Illness Pt s/p L THR    PT Comments    Pt progressing well with mobility and eager for dc home.  Pt spouse present and reviewed home therex with written instruction and car transfers.     Follow Up Recommendations  DC plan and follow up therapy as arranged by surgeon     Equipment Recommendations  Rolling walker with 5" wheels    Recommendations for Other Services       Precautions / Restrictions Precautions Precautions: Fall Restrictions Weight Bearing Restrictions: No Other Position/Activity Restrictions: WBAT    Mobility  Bed Mobility Overal bed mobility: Needs Assistance Bed Mobility: Supine to Sit     Supine to sit: Min guard;Supervision     General bed mobility comments: cues for sequence and use of R LE to self assist  Transfers Overall transfer level: Needs assistance Equipment used: Rolling walker (2 wheeled) Transfers: Sit to/from Stand Sit to Stand: Supervision         General transfer comment: pt self-cues for LE management and use of UEs to self assist  Ambulation/Gait Ambulation/Gait assistance: Min guard;Supervision Ambulation Distance (Feet): 150 Feet Assistive device: Rolling walker (2 wheeled) Gait Pattern/deviations: Step-to pattern;Step-through pattern;Decreased step length - right;Decreased step length - left;Shuffle;Trunk flexed Gait velocity: decr Gait velocity interpretation: Below normal speed for age/gender General Gait Details: min cues for posture, position from RW and initial sequence   Stairs            Wheelchair Mobility    Modified Rankin (Stroke Patients Only)       Balance Overall balance assessment: No apparent balance deficits (not formally assessed)                                          Cognition Arousal/Alertness: Awake/alert Behavior  During Therapy: WFL for tasks assessed/performed Overall Cognitive Status: Within Functional Limits for tasks assessed                                        Exercises Total Joint Exercises Ankle Circles/Pumps: AROM;Both;20 reps;Supine Quad Sets: AROM;Both;10 reps;Supine Heel Slides: AAROM;Left;20 reps;Supine Hip ABduction/ADduction: AAROM;Left;15 reps;Supine Long Arc Quad: AROM;10 reps;Seated;Left    General Comments        Pertinent Vitals/Pain Pain Assessment: 0-10 Pain Score: 3  Pain Location: L hip Pain Descriptors / Indicators: Aching;Sore Pain Intervention(s): Limited activity within patient's tolerance;Monitored during session;Premedicated before session;Ice applied    Home Living                      Prior Function            PT Goals (current goals can now be found in the care plan section) Acute Rehab PT Goals Patient 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 : A Little Difficulty sitting down on and standing up from a chair with arms (e.g., wheelchair, bedside commode, etc,.)?: A Little Help needed moving to and from a bed to chair (including a wheelchair)?: A Little Help needed walking in hospital room?: A Little Help needed climbing 3-5 steps with a railing? : A Little 6 Click Score: 18    End of Session Equipment Utilized During Treatment: Gait belt Activity Tolerance: Patient tolerated treatment well Patient  left: in chair Nurse Communication: Mobility status PT Visit Diagnosis: Difficulty in walking, not elsewhere classified (R26.2)     Time: 6606-3016 PT Time Calculation (min) (ACUTE ONLY): 38 min  Charges:  $Gait Training: 8-22 mins $Therapeutic Exercise: 8-22 mins $Therapeutic Activity: 8-22 mins                    G Codes:       Pg [phone]    Loisann Roach 08/08/2017, 4:24 PM

## 2017-08-11 DIAGNOSIS — M87052 Idiopathic aseptic necrosis of left femur: Secondary | ICD-10-CM | POA: Diagnosis not present

## 2017-08-12 ENCOUNTER — Telehealth: Payer: Self-pay | Admitting: Family Medicine

## 2017-08-12 IMAGING — MR MR HIP*L* WO/W CM
8 of 10 series · 30 of 40 positions shown · IV contrast (multihance)
Comparison: Radiographs 03/18/2017.

CLINICAL DATA: Persistent left hip pain for 6 months. Steroid
injection 2 weeks ago. No acute injury or prior relevant surgery.

Creatinine was obtained on site at [HOSPITAL] at [HOSPITAL].
Results: Creatinine 0.7 mg/dL.
EXAM:
MRI OF THE LEFT HIP WITHOUT AND WITH CONTRAST
TECHNIQUE: Multiplanar, multisequence MR imaging was performed both before and
after administration of intravenous contrast.
CONTRAST:  20mL MULTIHANCE GADOBENATE DIMEGLUMINE 529 MG/ML IV SOLN

[Series 9: STIR · coronal · left · 3.0mm · 1.25mm/px · 1 of 30 slices shown]
[im 1/30]
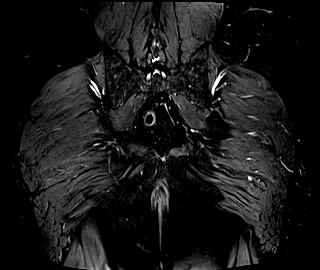

[Series 10: T1 · coronal · left · 3.0mm · 0.78mm/px · 3 of 30 slices shown (1 of 2)]
[im 1/30]
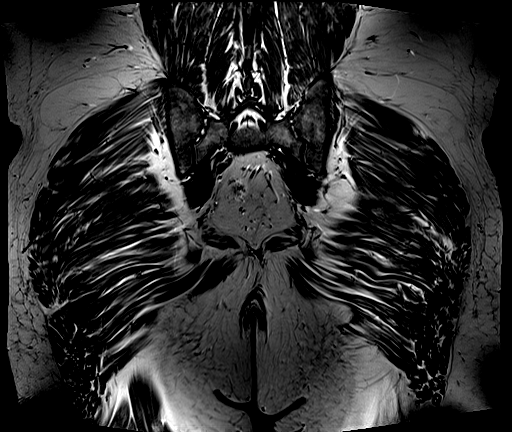
[im 15/30]
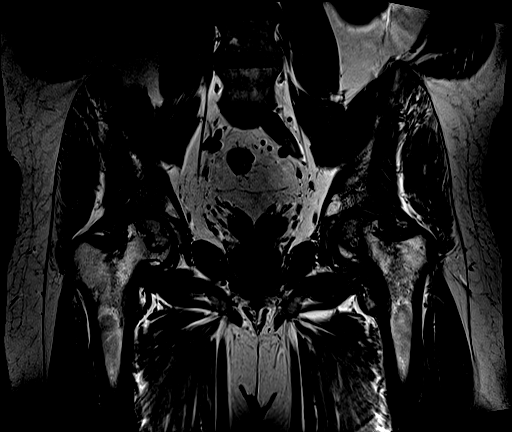
[im 30/30]
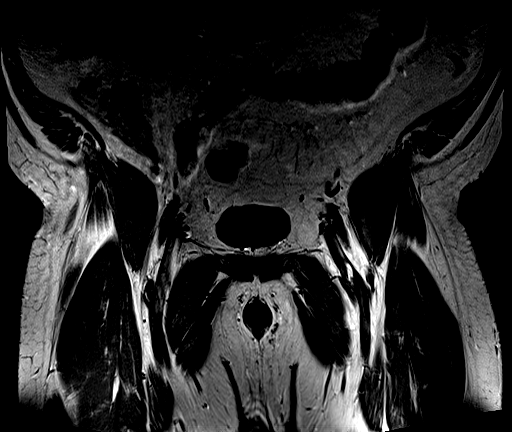

[Series 12: PD · sagittal · left · 3.0mm · 0.56mm/px · 5 of 35 slices shown]
[im 1/35]
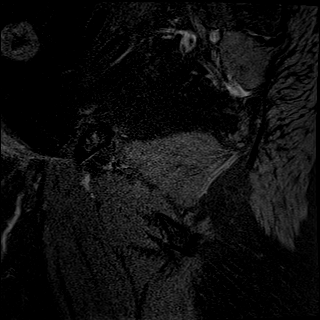
[im 9/35]
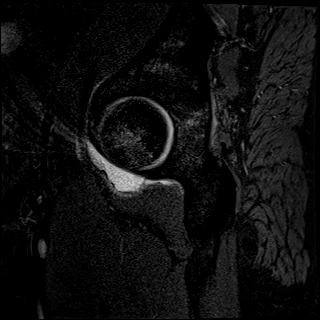
[im 18/35]
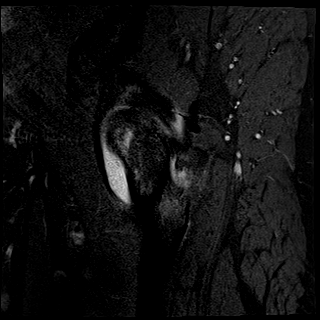
[im 26/35]
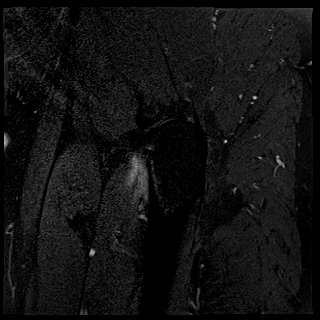
[im 35/35]
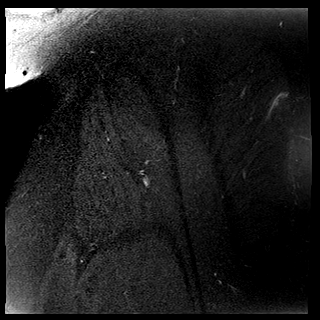

[Series 13: T1 · axial · left · 3.0mm · 0.78mm/px · z∈[+25,+138]mm · 4 of 30 slices shown (2 of 2)]
[im 1/30]
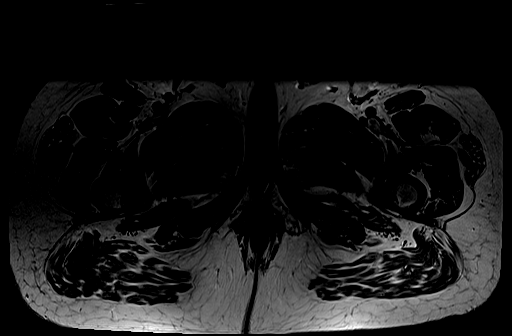
[im 10/30]
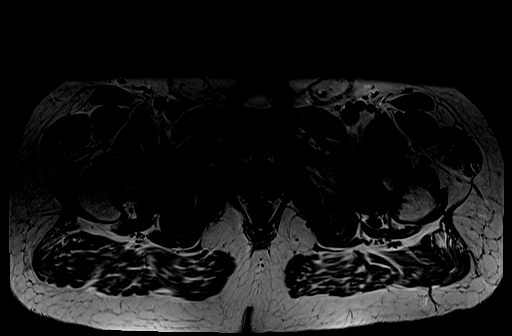
[im 20/30]
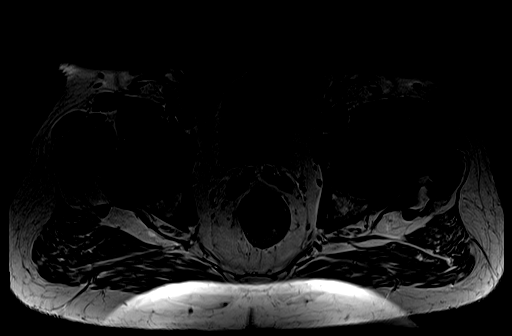
[im 30/30]
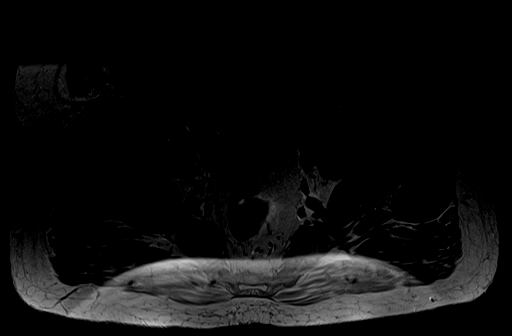

[Series 15: T2 fat-sat · axial · left · 3.0mm · 0.78mm/px · z∈[+27,+140]mm · 4 of 30 slices shown]
[im 1/30]
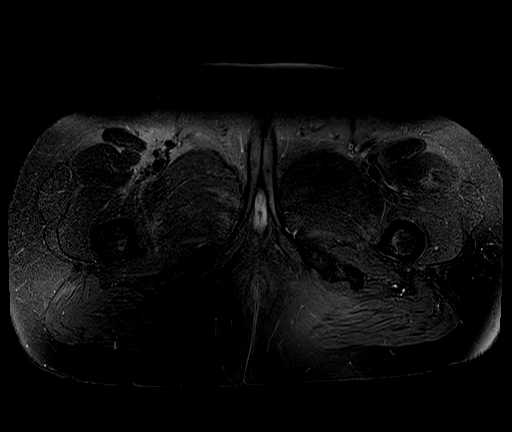
[im 10/30]
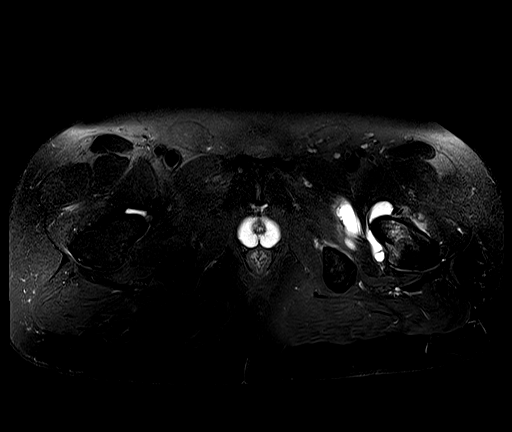
[im 20/30]
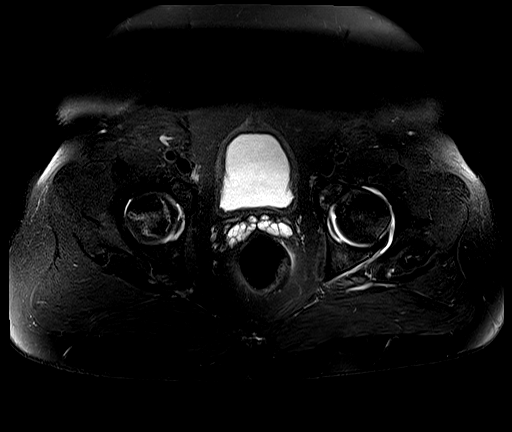
[im 30/30]
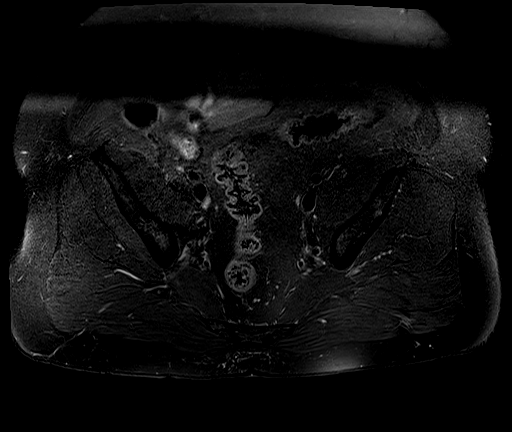

[Series 18: T1 fat-sat · sagittal · left · 3.0mm · 0.62mm/px · 5 of 35 slices shown (1 of 3)]
[im 1/35]
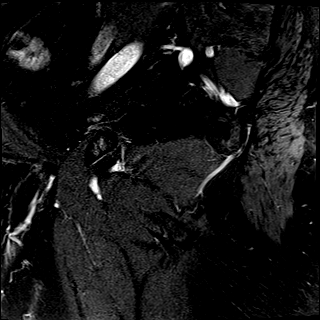
[im 9/35]
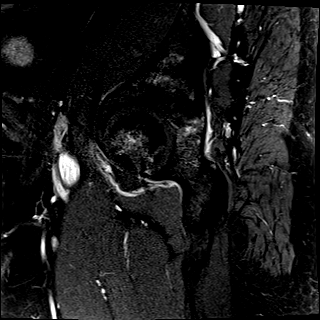
[im 18/35]
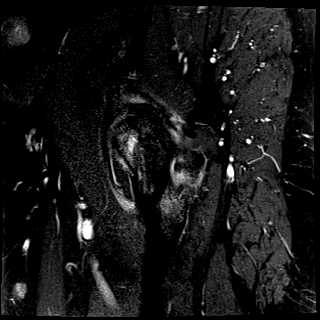
[im 26/35]
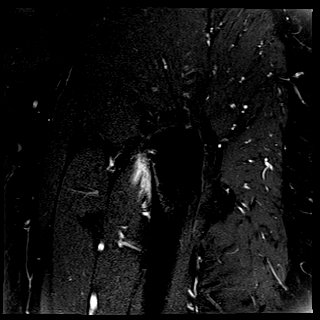
[im 35/35]
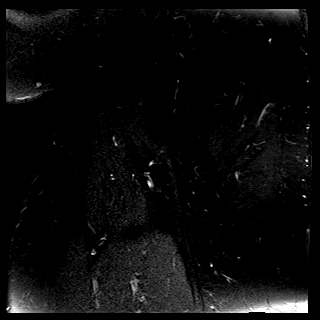

[Series 19: T1 fat-sat · axial · left · 3.0mm · 0.78mm/px · z∈[+23,+136]mm · 4 of 30 slices shown (2 of 3)]
[im 1/30]
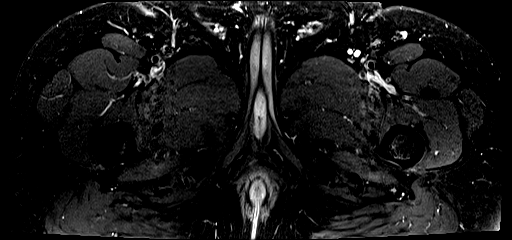
[im 10/30]
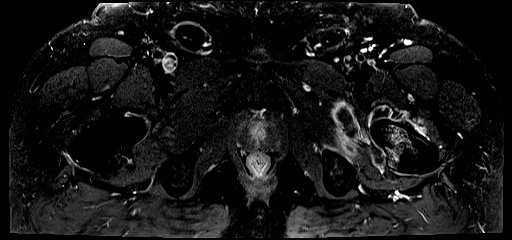
[im 20/30]
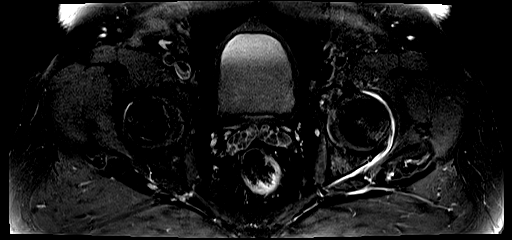
[im 30/30]
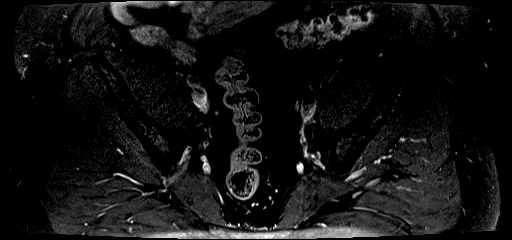

[Series 20: T1 fat-sat · coronal · left · 3.0mm · 0.78mm/px · 4 of 30 slices shown (3 of 3)]
[im 1/30]
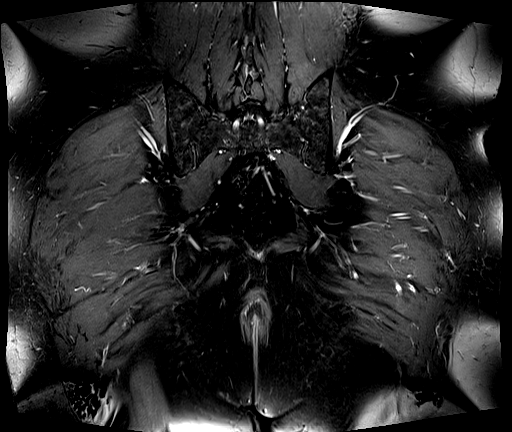
[im 10/30]
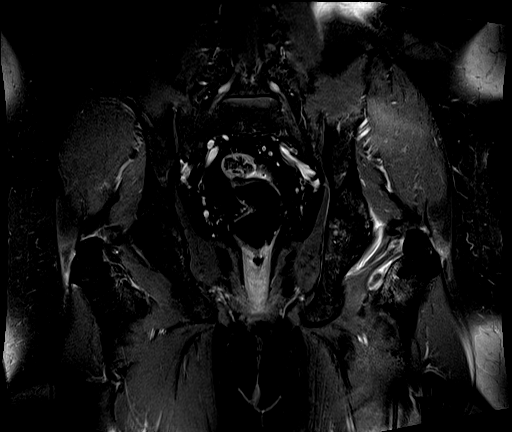
[im 20/30]
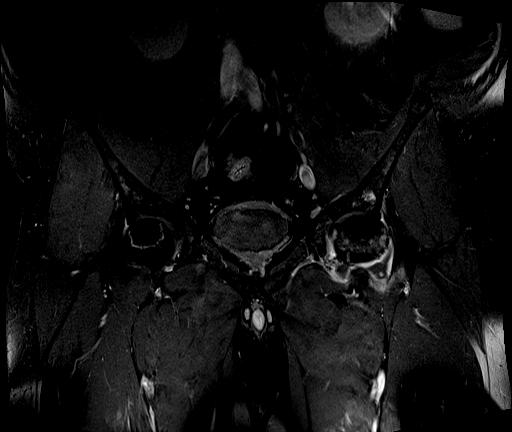
[im 30/30]
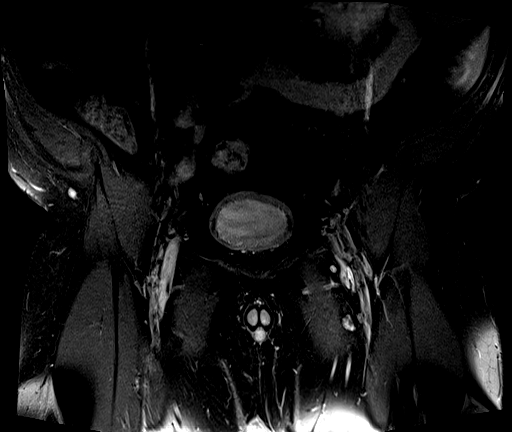

[30 of 40 positions shown; findings below may reference images not displayed]

FINDINGS: Bones: There is bilateral femoral head avascular necrosis with
serpiginous T2 hyperintense and T1 hypointense signal in both
femoral heads. The findings on the left are more advanced with
associated bone marrow edema throughout the femoral head and neck,
marrow enhancement and possible early subchondral collapse. Probable
reactive edema within the left superior acetabulum. The findings on
the right appear relatively quiescent without abnormal enhancement
or subchondral collapse. The remainder of the visualized bony pelvis
appears normal. The sacroiliac joints are intact.

Articular cartilage and labrum

Articular cartilage:  No focal chondral defects are seen.

Labrum: There is no gross labral tear or paralabral abnormality.

Joint or bursal effusion

Joint effusion: Moderate size left hip joint effusion with synovial
enhancement following contrast. No significant joint effusion on the
right.

Bursae: No focal periarticular fluid collection.

Muscles and tendons

Muscles and tendons: The visualized gluteus, hamstring and iliopsoas
tendons appear normal. The piriformis muscles appear symmetric.

Other findings

Miscellaneous: Prominent fat in both inguinal canals. The visualized
internal pelvic contents are otherwise unremarkable.
IMPRESSION: 1. Left femoral head avascular necrosis with possible early
subchondral collapse, marrow edema and enhancement in the femoral
head and neck, and associated synovitis.
2. Relatively quiescent AVN of the right femoral head. No
subchondral collapse or joint effusion.
3. No significant tendon findings.

## 2017-08-22 DIAGNOSIS — Z96642 Presence of left artificial hip joint: Secondary | ICD-10-CM | POA: Diagnosis not present

## 2017-08-22 DIAGNOSIS — Z471 Aftercare following joint replacement surgery: Secondary | ICD-10-CM | POA: Diagnosis not present

## 2017-08-25 ENCOUNTER — Other Ambulatory Visit: Payer: Self-pay | Admitting: Family Medicine

## 2017-09-16 ENCOUNTER — Encounter: Payer: Self-pay | Admitting: Family Medicine

## 2017-09-16 ENCOUNTER — Ambulatory Visit (INDEPENDENT_AMBULATORY_CARE_PROVIDER_SITE_OTHER): Payer: BLUE CROSS/BLUE SHIELD | Admitting: Family Medicine

## 2017-09-16 VITALS — BP 166/92 | HR 98 | Temp 97.0°F | Ht 67.0 in | Wt 253.0 lb

## 2017-09-16 DIAGNOSIS — I10 Essential (primary) hypertension: Secondary | ICD-10-CM

## 2017-09-16 NOTE — Progress Notes (Signed)
Subjective:  Patient ID: David Bartlett, male    DOB: 1971-11-22  Age: 46 y.o. MRN: [id number]  CC: Blood Pressure Check (pt here today for follow up on his BP and swelling which he says both are good)   HPI David Bartlett presents for  follow-up of hypertension. Patient has no history of headache chest pain or shortness of breath or recent cough. Patient also denies symptoms of TIA such as focal numbness or weakness. Patient not checking  blood pressure at home. Readings recentlygood at ortho office. Pain better 5 weeks out from hip replacement. Patient denies side effects from medication. States taking it regularly except BP up right now because he missed dose last night.   History David Bartlett has a past medical history of Abnormal EKG; Asthmatic bronchitis; HTN (hypertension); Left breast mass; and Smoker.   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   His family history includes Cancer in his father; Heart attack in his father and mother; Kidney disease in his mother.He reports that he quit smoking about 3 months ago. His smoking use included Cigarettes. He has a 10.00 pack-year smoking history. He has never used smokeless tobacco. He reports that he drinks about 1.8 oz of alcohol per week . He reports that he does not use drugs.  Current Outpatient Prescriptions on File Prior to Visit  Medication Sig Dispense Refill  . amLODipine (NORVASC) 5 MG tablet Take 1 tablet (5 mg total) by mouth daily. 90 tablet 3  . aspirin 81 MG chewable tablet Chew 1 tablet (81 mg total) by mouth 2 (two) times daily. 60 tablet 1  . atorvastatin (LIPITOR) 20 MG tablet TAKE ONE (1) TABLET EACH DAY 90 tablet 1  . Cetirizine HCl 10 MG TBDP Take 1 tablet by mouth daily.    . cholecalciferol (VITAMIN D) 1000 units tablet Take 1,000 Units by mouth daily.    . cloNIDine (CATAPRES) 0.1 MG tablet Take 1 tablet (0.1 mg total) by mouth 2 (two) times daily.  60 tablet 2  . docusate sodium (COLACE) 100 MG capsule Take 1 capsule (100 mg total) by mouth 2 (two) times daily. 60 capsule 1  . ondansetron (ZOFRAN) 4 MG tablet Take 1 tablet (4 mg total) by mouth every 6 (six) hours as needed for nausea. 20 tablet 0  . senna (SENOKOT) 8.6 MG TABS tablet Take 2 tablets (17.2 mg total) by mouth at bedtime. 120 each 0  . triamterene-hydrochlorothiazide (MAXZIDE) 75-50 MG tablet TAKE ONE TABLET EVERY MORNING 30 tablet 5  . VENTOLIN HFA 108 (90 Base) MCG/ACT inhaler USE 2 PUFFS EVERY 6 HOURS AS NEEDED 18 g 5   No current facility-administered medications on file prior to visit.     ROS Review of Systems  Constitutional: Negative for chills, diaphoresis and fever.  HENT: Negative.   Respiratory: Negative for shortness of breath.   Cardiovascular: Negative for chest pain.  Gastrointestinal: Negative.   Musculoskeletal: Negative for arthralgias.  Skin: Negative for rash.  Neurological: Negative for weakness and headaches.    Objective:  BP (!) 166/92   Pulse 98   Temp (!) 97 F (36.1 C) (Oral)   Ht 5\' 7"  (1.702 m)   Wt 253 lb (114.8 kg)   BMI 39.63 kg/m   BP Readings from Last 3 Encounters:  09/16/17 (!) 166/92  08/08/17 (!) 154/86  08/04/17 (!) 169/91    Wt Readings from Last 3 Encounters:  09/16/17  253 lb (114.8 kg)  08/07/17 259 lb (117.5 kg)  08/04/17 259 lb (117.5 kg)     Physical Exam  Constitutional: He is oriented to person, place, and time. He appears well-developed and well-nourished.  HENT:  Head: Normocephalic and atraumatic.  Right Ear: External ear normal.  Left Ear: External ear normal.  Mouth/Throat: No oropharyngeal exudate or posterior oropharyngeal erythema.  Eyes: Pupils are equal, round, and reactive to light.  Neck: Normal range of motion. Neck supple.  Cardiovascular: Normal rate and regular rhythm.   No murmur heard. Pulmonary/Chest: Breath sounds normal. No respiratory distress.  Neurological: He is alert  and oriented to person, place, and time.  Vitals reviewed.     Assessment & Plan:   David Bartlett was seen today for blood pressure check.  Diagnoses and all orders for this visit:  Essential hypertension  Morbid obesity due to excess calories ([place])   Allergies as of 09/16/2017      Reactions   Penicillins Other (See Comments)   Under arms swell      Medication List       Accurate as of 09/16/17  5:01 PM. Always use your most recent med list.          amLODipine 5 MG tablet Commonly known as:  NORVASC Take 1 tablet (5 mg total) by mouth daily.   aspirin 81 MG chewable tablet Chew 1 tablet (81 mg total) by mouth 2 (two) times daily.   atorvastatin 20 MG tablet Commonly known as:  LIPITOR TAKE ONE (1) TABLET EACH DAY   Cetirizine HCl 10 MG Tbdp Take 1 tablet by mouth daily.   cholecalciferol 1000 units tablet Commonly known as:  VITAMIN D Take 1,000 Units by mouth daily.   cloNIDine 0.1 MG tablet Commonly known as:  CATAPRES Take 1 tablet (0.1 mg total) by mouth 2 (two) times daily.   docusate sodium 100 MG capsule Commonly known as:  COLACE Take 1 capsule (100 mg total) by mouth 2 (two) times daily.   ondansetron 4 MG tablet Commonly known as:  ZOFRAN Take 1 tablet (4 mg total) by mouth every 6 (six) hours as needed for nausea.   senna 8.6 MG Tabs tablet Commonly known as:  SENOKOT Take 2 tablets (17.2 mg total) by mouth at bedtime.   triamterene-hydrochlorothiazide 75-50 MG tablet Commonly known as:  MAXZIDE TAKE ONE TABLET EVERY MORNING   VENTOLIN HFA 108 (90 Base) MCG/ACT inhaler Generic drug:  albuterol USE 2 PUFFS EVERY 6 HOURS AS NEEDED       No orders of the defined types were placed in this encounter.   Patient should check his blood pressure at home twice daily. Do 3 checks 1 minute apart for reliability. Do this routine for week drop off the log of his numbers. Be sure to take the meds.  We discussed the need to lose weight. Losing 25  pounds should help lower his blood pressure is much as another medicine could. Follow-up: Return in about 3 months (around 12/17/2017) for hypertension.  Claretta Fraise, M.D.

## 2017-09-17 ENCOUNTER — Telehealth: Payer: Self-pay | Admitting: Family Medicine

## 2017-09-17 NOTE — Telephone Encounter (Signed)
Please send in clindamycin 300 mg, two p.o. One hour before dental procedure

## 2017-09-17 NOTE — Telephone Encounter (Signed)
Please advise if antibiotic will be sent to pharmacy.

## 2017-09-18 ENCOUNTER — Other Ambulatory Visit: Payer: Self-pay | Admitting: Family Medicine

## 2017-09-18 MED ORDER — CLINDAMYCIN HCL 300 MG PO CAPS: ORAL_CAPSULE | ORAL | 0 refills | Status: DC

## 2017-09-18 NOTE — Telephone Encounter (Signed)
Prescription sent, patient notified.

## 2017-09-23 DIAGNOSIS — Z96642 Presence of left artificial hip joint: Secondary | ICD-10-CM | POA: Diagnosis not present

## 2017-09-23 DIAGNOSIS — Z471 Aftercare following joint replacement surgery: Secondary | ICD-10-CM | POA: Diagnosis not present

## 2017-10-14 ENCOUNTER — Other Ambulatory Visit: Payer: Self-pay | Admitting: Family Medicine

## 2017-12-10 ENCOUNTER — Other Ambulatory Visit: Payer: Self-pay | Admitting: Family Medicine

## 2017-12-19 ENCOUNTER — Ambulatory Visit: Payer: BLUE CROSS/BLUE SHIELD | Admitting: Family Medicine

## 2017-12-19 ENCOUNTER — Encounter: Payer: Self-pay | Admitting: Family Medicine

## 2017-12-19 VITALS — BP 145/80 | HR 87 | Ht 67.0 in | Wt 254.0 lb

## 2017-12-19 DIAGNOSIS — I10 Essential (primary) hypertension: Secondary | ICD-10-CM | POA: Diagnosis not present

## 2017-12-19 DIAGNOSIS — E782 Mixed hyperlipidemia: Secondary | ICD-10-CM | POA: Diagnosis not present

## 2017-12-19 LAB — CMP14+EGFR
ALBUMIN: 4.5 g/dL (ref 3.5–5.5)
ALK PHOS: 86 IU/L (ref 39–117)
ALT: 44 IU/L (ref 0–44)
AST: 29 IU/L (ref 0–40)
Albumin/Globulin Ratio: 1.6 (ref 1.2–2.2)
BILIRUBIN TOTAL: 0.6 mg/dL (ref 0.0–1.2)
BUN / CREAT RATIO: 12 (ref 9–20)
BUN: 10 mg/dL (ref 6–24)
CHLORIDE: 97 mmol/L (ref 96–106)
CO2: 24 mmol/L (ref 20–29)
CREATININE: 0.86 mg/dL (ref 0.76–1.27)
Calcium: 9.7 mg/dL (ref 8.7–10.2)
GFR calc Af Amer: 119 mL/min/{1.73_m2} (ref >59)
GFR calc non Af Amer: 103 mL/min/{1.73_m2} (ref >59)
GLOBULIN, TOTAL: 2.8 g/dL (ref 1.5–4.5)
Glucose: 150 mg/dL — ABNORMAL HIGH (ref 65–99)
POTASSIUM: 4.2 mmol/L (ref 3.5–5.2)
SODIUM: 141 mmol/L (ref 134–144)
Total Protein: 7.3 g/dL (ref 6.0–8.5)

## 2017-12-19 LAB — CBC WITH DIFFERENTIAL/PLATELET
BASOS ABS: 0 10*3/uL (ref 0.0–0.2)
Basos: 0 %
EOS (ABSOLUTE): 0.1 10*3/uL (ref 0.0–0.4)
Eos: 1 %
HEMATOCRIT: 44.6 % (ref 37.5–51.0)
HEMOGLOBIN: 15.8 g/dL (ref 13.0–17.7)
Immature Grans (Abs): 0 10*3/uL (ref 0.0–0.1)
Immature Granulocytes: 0 %
LYMPHS ABS: 3.4 10*3/uL — AB (ref 0.7–3.1)
Lymphs: 32 %
MCH: 29.9 pg (ref 26.6–33.0)
MCHC: 35.4 g/dL (ref 31.5–35.7)
MCV: 85 fL (ref 79–97)
MONOS ABS: 1 10*3/uL — AB (ref 0.1–0.9)
Monocytes: 9 %
NEUTROS ABS: 6 10*3/uL (ref 1.4–7.0)
Neutrophils: 58 %
Platelets: 240 10*3/uL (ref 150–379)
RBC: 5.28 x10E6/uL (ref 4.14–5.80)
RDW: 13.6 % (ref 12.3–15.4)
WBC: 10.5 10*3/uL (ref 3.4–10.8)

## 2017-12-19 LAB — LIPID PANEL
CHOLESTEROL TOTAL: 124 mg/dL (ref 100–199)
Chol/HDL Ratio: 3.6 ratio (ref 0.0–5.0)
HDL: 34 mg/dL — ABNORMAL LOW (ref >39)
LDL Calculated: 46 mg/dL (ref 0–99)
Triglycerides: 218 mg/dL — ABNORMAL HIGH (ref 0–149)
VLDL Cholesterol Cal: 44 mg/dL — ABNORMAL HIGH (ref 5–40)

## 2017-12-19 MED ORDER — CLONIDINE HCL 0.1 MG PO TABS: 0.1000 mg | ORAL_TABLET | Freq: Two times a day (BID) | ORAL | 1 refills | Status: DC

## 2017-12-19 MED ORDER — ATORVASTATIN CALCIUM 20 MG PO TABS: ORAL_TABLET | ORAL | 1 refills | Status: DC

## 2017-12-19 MED ORDER — TRIAMTERENE-HCTZ 75-50 MG PO TABS: 1.0000 | ORAL_TABLET | Freq: Every morning | ORAL | 1 refills | Status: DC

## 2017-12-19 NOTE — Progress Notes (Signed)
Subjective:  Patient ID: David Bartlett, male    DOB: 05-Oct-1971  Age: 47 y.o. MRN: [id number]  CC: Hypertension (pt here today for routine follow up of her chronic medical condtions)   HPI ROCKO FESPERMAN presents for  follow-up of hypertension. Patient has no history of headache chest pain or shortness of breath or recent cough. Patient also denies symptoms of TIA such as focal numbness or weakness. Patient checks  blood pressure at home. Readings recently nml. Patient denies side effects from medication. States taking it regularly. Patient in for follow-up of elevated cholesterol. Doing well without complaints on current medication. Denies side effects of statin including myalgia and arthralgia and nausea. Also in today for liver function testing. Currently no chest pain, shortness of breath or other cardiovascular related symptoms noted.  History Ramil has a past medical history of Abnormal EKG, Asthmatic bronchitis, HTN (hypertension), Left breast mass, and Smoker.   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   His family history includes Cancer in his father; Heart attack in his father and mother; Kidney disease in his mother.He reports that he quit smoking about 7 months ago. His smoking use included cigarettes. He has a 10.00 pack-year smoking history. he has never used smokeless tobacco. He reports that he drinks about 1.8 oz of alcohol per week. He reports that he does not use drugs.  Current Outpatient Medications on File Prior to Visit  Medication Sig Dispense Refill  . amLODipine (NORVASC) 5 MG tablet Take 1 tablet (5 mg total) by mouth daily. 90 tablet 3  . aspirin 81 MG chewable tablet Chew 1 tablet (81 mg total) by mouth 2 (two) times daily. 60 tablet 1  . Cetirizine HCl 10 MG TBDP Take 1 tablet by mouth daily.    . cholecalciferol (VITAMIN D) 1000 units tablet Take 1,000 Units by mouth daily.    . VENTOLIN  HFA 108 (90 Base) MCG/ACT inhaler USE 2 PUFFS EVERY 6 HOURS AS NEEDED 18 g 5  . clindamycin (CLEOCIN) 300 MG capsule Take two tablets by mouth one hour before procedure. (Patient not taking: Reported on 12/19/2017) 2 capsule 0   No current facility-administered medications on file prior to visit.     ROS Review of Systems  Constitutional: Negative for chills, diaphoresis, fever and unexpected weight change.  HENT: Negative for congestion, hearing loss, rhinorrhea and sore throat.   Eyes: Negative for visual disturbance.  Respiratory: Negative for cough and shortness of breath.   Cardiovascular: Negative for chest pain.  Gastrointestinal: Negative for abdominal pain, constipation and diarrhea.  Genitourinary: Negative for dysuria and flank pain.  Musculoskeletal: Negative for arthralgias and joint swelling.  Skin: Negative for rash.  Neurological: Negative for dizziness and headaches.  Psychiatric/Behavioral: Negative for dysphoric mood and sleep disturbance.    Objective:  BP (!) 145/80   Pulse 87   Ht '5\' 7"'  (1.702 m)   Wt 254 lb (115.2 kg)   BMI 39.78 kg/m   BP Readings from Last 3 Encounters:  12/19/17 (!) 145/80  09/16/17 (!) 166/92  08/08/17 (!) 154/86    Wt Readings from Last 3 Encounters:  12/19/17 254 lb (115.2 kg)  09/16/17 253 lb (114.8 kg)  08/07/17 259 lb (117.5 kg)     Physical Exam  Constitutional: He is oriented to person, place, and time. He appears well-developed and well-nourished. No distress.  HENT:  Head: Normocephalic and atraumatic.  Right Ear:  External ear normal.  Left Ear: External ear normal.  Nose: Nose normal.  Mouth/Throat: Oropharynx is clear and moist.  Eyes: Conjunctivae and EOM are normal. Pupils are equal, round, and reactive to light.  Neck: Normal range of motion. Neck supple. No thyromegaly present.  Cardiovascular: Normal rate, regular rhythm and normal heart sounds.  No murmur heard. Pulmonary/Chest: Effort normal and breath  sounds normal. No respiratory distress. He has no wheezes. He has no rales.  Abdominal: Soft. Bowel sounds are normal. He exhibits no distension. There is no tenderness.  Lymphadenopathy:    He has no cervical adenopathy.  Neurological: He is alert and oriented to person, place, and time. He has normal reflexes.  Skin: Skin is warm and dry.  Psychiatric: He has a normal mood and affect. His behavior is normal. Judgment and thought content normal.      Assessment & Plan:   Mort was seen today for hypertension.  Diagnoses and all orders for this visit:  Essential hypertension -     CBC with Differential/Platelet -     CMP14+EGFR  Mixed hyperlipidemia -     Lipid panel  Other orders -     atorvastatin (LIPITOR) 20 MG tablet; TAKE ONE (1) TABLET EACH DAY -     cloNIDine (CATAPRES) 0.1 MG tablet; Take 1 tablet (0.1 mg total) by mouth 2 (two) times daily. -     triamterene-hydrochlorothiazide (MAXZIDE) 75-50 MG tablet; Take 1 tablet by mouth every morning.   Allergies as of 12/19/2017      Reactions   Penicillins Other (See Comments)   Under arms swell      Medication List        Accurate as of 12/19/17 11:59 PM. Always use your most recent med list.          amLODipine 5 MG tablet Commonly known as:  NORVASC Take 1 tablet (5 mg total) by mouth daily.   aspirin 81 MG chewable tablet Chew 1 tablet (81 mg total) by mouth 2 (two) times daily.   atorvastatin 20 MG tablet Commonly known as:  LIPITOR TAKE ONE (1) TABLET EACH DAY   Cetirizine HCl 10 MG Tbdp Take 1 tablet by mouth daily.   cholecalciferol 1000 units tablet Commonly known as:  VITAMIN D Take 1,000 Units by mouth daily.   clindamycin 300 MG capsule Commonly known as:  CLEOCIN Take two tablets by mouth one hour before procedure.   cloNIDine 0.1 MG tablet Commonly known as:  CATAPRES Take 1 tablet (0.1 mg total) by mouth 2 (two) times daily.   triamterene-hydrochlorothiazide 75-50 MG tablet Commonly  known as:  MAXZIDE Take 1 tablet by mouth every morning.   VENTOLIN HFA 108 (90 Base) MCG/ACT inhaler Generic drug:  albuterol USE 2 PUFFS EVERY 6 HOURS AS NEEDED       Meds ordered this encounter  Medications  . atorvastatin (LIPITOR) 20 MG tablet    Sig: TAKE ONE (1) TABLET EACH DAY    Dispense:  90 tablet    Refill:  1  . cloNIDine (CATAPRES) 0.1 MG tablet    Sig: Take 1 tablet (0.1 mg total) by mouth 2 (two) times daily.    Dispense:  180 tablet    Refill:  1  . triamterene-hydrochlorothiazide (MAXZIDE) 75-50 MG tablet    Sig: Take 1 tablet by mouth every morning.    Dispense:  90 tablet    Refill:  1    Continue mds as is.  Follow-up: Return  in about 6 months (around 06/18/2018).  Claretta Fraise, M.D.

## 2017-12-21 ENCOUNTER — Encounter: Payer: Self-pay | Admitting: Family Medicine

## 2017-12-30 ENCOUNTER — Other Ambulatory Visit: Payer: Self-pay | Admitting: Family Medicine

## 2018-02-13 ENCOUNTER — Other Ambulatory Visit: Payer: Self-pay | Admitting: Family Medicine

## 2018-02-13 ENCOUNTER — Telehealth: Payer: Self-pay

## 2018-02-13 MED ORDER — ALBUTEROL SULFATE HFA 108 (90 BASE) MCG/ACT IN AERS: 2.0000 | INHALATION_SPRAY | Freq: Four times a day (QID) | RESPIRATORY_TRACT | 11 refills | Status: DC | PRN

## 2018-02-13 NOTE — Telephone Encounter (Signed)
I sent in the requested prescription 

## 2018-02-13 NOTE — Telephone Encounter (Signed)
Insurance denied Albuterol HFA   Must use Proair HFA

## 2018-06-09 ENCOUNTER — Other Ambulatory Visit: Payer: Self-pay | Admitting: Family Medicine

## 2018-07-03 ENCOUNTER — Ambulatory Visit: Payer: BLUE CROSS/BLUE SHIELD | Admitting: Family Medicine

## 2018-07-31 ENCOUNTER — Encounter: Payer: Self-pay | Admitting: Family Medicine

## 2018-07-31 ENCOUNTER — Ambulatory Visit: Payer: BLUE CROSS/BLUE SHIELD | Admitting: Family Medicine

## 2018-07-31 VITALS — BP 142/82 | HR 86 | Temp 97.3°F | Ht 67.0 in | Wt 251.2 lb

## 2018-07-31 DIAGNOSIS — R600 Localized edema: Secondary | ICD-10-CM | POA: Diagnosis not present

## 2018-07-31 DIAGNOSIS — Z720 Tobacco use: Secondary | ICD-10-CM | POA: Diagnosis not present

## 2018-07-31 DIAGNOSIS — I10 Essential (primary) hypertension: Secondary | ICD-10-CM

## 2018-07-31 DIAGNOSIS — E782 Mixed hyperlipidemia: Secondary | ICD-10-CM | POA: Diagnosis not present

## 2018-07-31 MED ORDER — CLONIDINE HCL 0.2 MG PO TABS: 0.2000 mg | ORAL_TABLET | Freq: Two times a day (BID) | ORAL | 2 refills | Status: DC

## 2018-07-31 MED ORDER — AMLODIPINE BESYLATE 5 MG PO TABS: 5.0000 mg | ORAL_TABLET | Freq: Every day | ORAL | 3 refills | Status: DC

## 2018-07-31 MED ORDER — DICLOFENAC SODIUM 75 MG PO TBEC: 75.0000 mg | DELAYED_RELEASE_TABLET | Freq: Two times a day (BID) | ORAL | 2 refills | Status: DC

## 2018-07-31 MED ORDER — TRIAMTERENE-HCTZ 75-50 MG PO TABS: 1.0000 | ORAL_TABLET | Freq: Every morning | ORAL | 1 refills | Status: DC

## 2018-07-31 MED ORDER — ATORVASTATIN CALCIUM 20 MG PO TABS: ORAL_TABLET | ORAL | 1 refills | Status: DC

## 2018-07-31 NOTE — Progress Notes (Signed)
Subjective:  Patient ID: David Bartlett, male    DOB: Feb 23, 1971  Age: 47 y.o. MRN: [id number]  CC: Medical Management of Chronic Issues   HPI CHIN WACHTER presents for  follow-up of hypertension. Patient has no history of headache chest pain or shortness of breath or recent cough. Patient also denies symptoms of TIA such as focal numbness or weakness. Patient denies side effects from medication. States taking it regularly.   History Keevan has a past medical history of Abnormal EKG, Asthmatic bronchitis, HTN (hypertension), Left breast mass, and Smoker.   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   His family history includes Cancer in his father; Heart attack in his father and mother; Kidney disease in his mother.He reports that he quit smoking about 14 months ago. His smoking use included cigarettes. He has a 10.00 pack-year smoking history. He has never used smokeless tobacco. He reports that he drinks about 3.0 standard drinks of alcohol per week. He reports that he does not use drugs.  Current Outpatient Medications on File Prior to Visit  Medication Sig Dispense Refill  . albuterol (PROAIR HFA) 108 (90 Base) MCG/ACT inhaler Inhale 2 puffs into the lungs every 6 (six) hours as needed for wheezing or shortness of breath. 1 Inhaler 11  . aspirin 81 MG chewable tablet Chew 1 tablet (81 mg total) by mouth 2 (two) times daily. 60 tablet 1  . Cetirizine HCl 10 MG TBDP Take 1 tablet by mouth daily.    . cholecalciferol (VITAMIN D) 1000 units tablet Take 1,000 Units by mouth daily.    . VENTOLIN HFA 108 (90 Base) MCG/ACT inhaler USE 2 PUFFS EVERY 6 HOURS AS NEEDED 18 g 5   No current facility-administered medications on file prior to visit.     ROS Review of Systems  Constitutional: Negative for fever.  Respiratory: Negative for shortness of breath.   Cardiovascular: Negative for chest pain.  Musculoskeletal:  Positive for arthralgias.  Skin: Negative for rash.    Objective:  BP (!) 142/82   Pulse 86   Temp (!) 97.3 F (36.3 C) (Oral)   Ht _0  (1.702 m)   Wt 251 lb 4 oz (114 kg)   BMI 39.35 kg/m   BP Readings from Last 3 Encounters:  07/31/18 (!) 142/82  12/19/17 (!) 145/80  09/16/17 (!) 166/92    Wt Readings from Last 3 Encounters:  07/31/18 251 lb 4 oz (114 kg)  12/19/17 254 lb (115.2 kg)  09/16/17 253 lb (114.8 kg)     Physical Exam  Constitutional: He is oriented to person, place, and time. He appears well-developed and well-nourished.  HENT:  Head: Normocephalic and atraumatic.  Right Ear: External ear normal.  Left Ear: External ear normal.  Mouth/Throat: No oropharyngeal exudate or posterior oropharyngeal erythema.  Eyes: Pupils are equal, round, and reactive to light.  Neck: Normal range of motion. Neck supple.  Cardiovascular: Normal rate and regular rhythm.  No murmur heard. Pulmonary/Chest: Breath sounds normal. No respiratory distress.  Neurological: He is alert and oriented to person, place, and time.  Vitals reviewed.     Assessment & Plan:   Fain was seen today for medical management of chronic issues.  Diagnoses and all orders for this visit:  Essential hypertension -     CBC with Differential/Platelet -     CMP14+EGFR  Mixed hyperlipidemia  Tobacco abuse  Bilateral lower extremity edema -  US Venous Img Lower Bilateral; Future  Other orders -     cloNIDine (CATAPRES) 0.2 MG tablet; Take 1 tablet (0.2 mg total) by mouth 2 (two) times daily. -     diclofenac (VOLTAREN) 75 MG EC tablet; Take 1 tablet (75 mg total) by mouth 2 (two) times daily. -     amLODipine (NORVASC) 5 MG tablet; Take 1 tablet (5 mg total) by mouth daily. -     atorvastatin (LIPITOR) 20 MG tablet; TAKE ONE (1) TABLET EACH DAY -     triamterene-hydrochlorothiazide (MAXZIDE) 75-50 MG tablet; Take 1 tablet by mouth every morning.   Allergies as of 07/31/2018       Reactions   Penicillins Other (See Comments)   Under arms swell      Medication List        Accurate as of 07/31/18 11:59 PM. Always use your most recent med list.          amLODipine 5 MG tablet Commonly known as:  NORVASC Take 1 tablet (5 mg total) by mouth daily.   aspirin 81 MG chewable tablet Chew 1 tablet (81 mg total) by mouth 2 (two) times daily.   atorvastatin 20 MG tablet Commonly known as:  LIPITOR TAKE ONE (1) TABLET EACH DAY   Cetirizine HCl 10 MG Tbdp Take 1 tablet by mouth daily.   cholecalciferol 1000 units tablet Commonly known as:  VITAMIN D Take 1,000 Units by mouth daily.   cloNIDine 0.2 MG tablet Commonly known as:  CATAPRES Take 1 tablet (0.2 mg total) by mouth 2 (two) times daily.   diclofenac 75 MG EC tablet Commonly known as:  VOLTAREN Take 1 tablet (75 mg total) by mouth 2 (two) times daily.   triamterene-hydrochlorothiazide 75-50 MG tablet Commonly known as:  MAXZIDE Take 1 tablet by mouth every morning.   VENTOLIN HFA 108 (90 Base) MCG/ACT inhaler Generic drug:  albuterol USE 2 PUFFS EVERY 6 HOURS AS NEEDED   albuterol 108 (90 Base) MCG/ACT inhaler Commonly known as:  PROVENTIL HFA;VENTOLIN HFA Inhale 2 puffs into the lungs every 6 (six) hours as needed for wheezing or shortness of breath.       Meds ordered this encounter  Medications  . cloNIDine (CATAPRES) 0.2 MG tablet    Sig: Take 1 tablet (0.2 mg total) by mouth 2 (two) times daily.    Dispense:  60 tablet    Refill:  2  . diclofenac (VOLTAREN) 75 MG EC tablet    Sig: Take 1 tablet (75 mg total) by mouth 2 (two) times daily.    Dispense:  60 tablet    Refill:  2  . amLODipine (NORVASC) 5 MG tablet    Sig: Take 1 tablet (5 mg total) by mouth daily.    Dispense:  90 tablet    Refill:  3  . atorvastatin (LIPITOR) 20 MG tablet    Sig: TAKE ONE (1) TABLET EACH DAY    Dispense:  90 tablet    Refill:  1  . triamterene-hydrochlorothiazide (MAXZIDE) 75-50 MG tablet     Sig: Take 1 tablet by mouth every morning.    Dispense:  90 tablet    Refill:  1     Follow-up: Return in about 6 months (around 01/31/2019).  Claretta Fraise, M.D.

## 2018-07-31 NOTE — Patient Instructions (Signed)

## 2018-08-01 LAB — CBC WITH DIFFERENTIAL/PLATELET
BASOS ABS: 0 10*3/uL (ref 0.0–0.2)
Basos: 0 %
EOS (ABSOLUTE): 0.2 10*3/uL (ref 0.0–0.4)
Eos: 2 %
HEMOGLOBIN: 14.6 g/dL (ref 13.0–17.7)
Hematocrit: 42.5 % (ref 37.5–51.0)
IMMATURE GRANS (ABS): 0 10*3/uL (ref 0.0–0.1)
Immature Granulocytes: 0 %
LYMPHS: 42 %
Lymphocytes Absolute: 4.3 10*3/uL — ABNORMAL HIGH (ref 0.7–3.1)
MCH: 30.2 pg (ref 26.6–33.0)
MCHC: 34.4 g/dL (ref 31.5–35.7)
MCV: 88 fL (ref 79–97)
Monocytes Absolute: 1 10*3/uL — ABNORMAL HIGH (ref 0.1–0.9)
Monocytes: 10 %
Neutrophils Absolute: 4.7 10*3/uL (ref 1.4–7.0)
Neutrophils: 46 %
Platelets: 242 10*3/uL (ref 150–450)
RBC: 4.83 x10E6/uL (ref 4.14–5.80)
RDW: 13.4 % (ref 12.3–15.4)
WBC: 10.2 10*3/uL (ref 3.4–10.8)

## 2018-08-01 LAB — CMP14+EGFR
ALK PHOS: 81 IU/L (ref 39–117)
ALT: 38 IU/L (ref 0–44)
AST: 25 IU/L (ref 0–40)
Albumin/Globulin Ratio: 1.8 (ref 1.2–2.2)
Albumin: 4.2 g/dL (ref 3.5–5.5)
BUN/Creatinine Ratio: 14 (ref 9–20)
BUN: 11 mg/dL (ref 6–24)
Bilirubin Total: 0.4 mg/dL (ref 0.0–1.2)
CO2: 25 mmol/L (ref 20–29)
CREATININE: 0.81 mg/dL (ref 0.76–1.27)
Calcium: 9.2 mg/dL (ref 8.7–10.2)
Chloride: 101 mmol/L (ref 96–106)
GFR calc Af Amer: 121 mL/min/{1.73_m2} (ref >59)
GFR calc non Af Amer: 105 mL/min/{1.73_m2} (ref >59)
GLUCOSE: 116 mg/dL — AB (ref 65–99)
Globulin, Total: 2.3 g/dL (ref 1.5–4.5)
Potassium: 3.6 mmol/L (ref 3.5–5.2)
Sodium: 142 mmol/L (ref 134–144)
TOTAL PROTEIN: 6.5 g/dL (ref 6.0–8.5)

## 2018-08-06 ENCOUNTER — Ambulatory Visit (HOSPITAL_COMMUNITY): Payer: BLUE CROSS/BLUE SHIELD

## 2018-08-07 DIAGNOSIS — Z96642 Presence of left artificial hip joint: Secondary | ICD-10-CM | POA: Diagnosis not present

## 2018-08-21 ENCOUNTER — Other Ambulatory Visit: Payer: Self-pay | Admitting: Family Medicine

## 2018-09-09 ENCOUNTER — Other Ambulatory Visit: Payer: Self-pay | Admitting: Family Medicine

## 2018-10-01 ENCOUNTER — Other Ambulatory Visit: Payer: Self-pay | Admitting: Family Medicine

## 2018-11-02 ENCOUNTER — Ambulatory Visit: Payer: BLUE CROSS/BLUE SHIELD | Admitting: Family Medicine

## 2018-11-10 ENCOUNTER — Ambulatory Visit: Payer: BLUE CROSS/BLUE SHIELD | Admitting: Family Medicine

## 2018-11-10 ENCOUNTER — Encounter: Payer: Self-pay | Admitting: Family Medicine

## 2018-11-10 ENCOUNTER — Telehealth: Payer: Self-pay | Admitting: Family Medicine

## 2018-11-10 VITALS — BP 144/96 | HR 79 | Temp 97.9°F | Ht 67.0 in | Wt 249.0 lb

## 2018-11-10 DIAGNOSIS — R39198 Other difficulties with micturition: Secondary | ICD-10-CM

## 2018-11-10 DIAGNOSIS — I10 Essential (primary) hypertension: Secondary | ICD-10-CM

## 2018-11-10 DIAGNOSIS — Z125 Encounter for screening for malignant neoplasm of prostate: Secondary | ICD-10-CM

## 2018-11-10 DIAGNOSIS — E119 Type 2 diabetes mellitus without complications: Secondary | ICD-10-CM

## 2018-11-10 DIAGNOSIS — R739 Hyperglycemia, unspecified: Secondary | ICD-10-CM | POA: Diagnosis not present

## 2018-11-10 DIAGNOSIS — Z720 Tobacco use: Secondary | ICD-10-CM

## 2018-11-10 LAB — BAYER DCA HB A1C WAIVED: HB A1C: 8.2 % — AB (ref <7.0)

## 2018-11-10 MED ORDER — VARENICLINE TARTRATE 1 MG PO TABS: 1.0000 mg | ORAL_TABLET | Freq: Two times a day (BID) | ORAL | 2 refills | Status: DC

## 2018-11-10 MED ORDER — VARENICLINE TARTRATE 0.5 MG X 11 & 1 MG X 42 PO MISC: ORAL | 0 refills | Status: DC

## 2018-11-10 MED ORDER — AMLODIPINE BESYLATE 10 MG PO TABS: 10.0000 mg | ORAL_TABLET | Freq: Every day | ORAL | 0 refills | Status: DC

## 2018-11-10 NOTE — Progress Notes (Signed)
Subjective: CC: establish care w/ new doctor; hypertension PCP: Claretta Fraise, MD David Bartlett is a 47 y.o. male presenting to clinic today for:  1.  Hypertension/ tobacco use disorder Patient with several year history of hypertension.  He notes that there is been some confusion over his blood pressure regimen.  He is currently taking triamterene/hydrochlorothiazide 70/50 milligrams daily, Norvasc 5 mg daily, clonidine 0.2 mg p.o. twice daily.  He denies any chest pain, shortness of breath, visual disturbance.  He occasionally has lower extremity edema.  He denies any mood instability.  He has noticed that his blood pressure will shoot up quite high when he misses the clonidine dose or if he is late for the clonidine dose.  He is an active every day smoker and has been for 30 years.  He currently smokes about 1 pack/day.  He was successful in smoking cessation for about 6 months with Chantix.  He does report that the Chantix gave him some bad dreams but he was able to tolerate the medication for about a month and was successful in cessation.  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  2. Decreased urinary stream Patient reports he has had some changes in urinary stream/flow.  He denies any nocturia, hematuria.  He feels "tightness in the groin area."  No genital lesions.  Last ejaculation was greater than 24 hours ago.   ROS: Per HPI  Allergies  Allergen Reactions  . Penicillins Other (See Comments)    Under arms swell   Past Medical History:  Diagnosis Date  . Abnormal EKG   . Asthmatic bronchitis   . HTN (hypertension)   . Left breast mass    resolved on its own   . Smoker     Current Outpatient Medications:  .  albuterol (PROAIR HFA) 108 (90 Base) MCG/ACT inhaler, Inhale 2 puffs into the lungs every 6 (six) hours as needed for wheezing or shortness of breath., Disp: 1 Inhaler, Rfl: 11 .  amLODipine  (NORVASC) 5 MG tablet, Take 1 tablet (5 mg total) by mouth daily., Disp: 90 tablet, Rfl: 3 .  aspirin 81 MG chewable tablet, Chew 1 tablet (81 mg total) by mouth 2 (two) times daily., Disp: 60 tablet, Rfl: 1 .  atorvastatin (LIPITOR) 20 MG tablet, TAKE ONE (1) TABLET EACH DAY, Disp: 90 tablet, Rfl: 1 .  Cetirizine HCl 10 MG TBDP, Take 1 tablet by mouth daily., Disp: , Rfl:  .  cholecalciferol (VITAMIN D) 1000 units tablet, Take 1,000 Units by mouth daily., Disp: , Rfl:  .  cloNIDine (CATAPRES) 0.1 MG tablet, TAKE ONE TABLET BY MOUTH TWICE DAILY, Disp: 180 tablet, Rfl: 1 .  diclofenac (VOLTAREN) 75 MG EC tablet, Take 1 tablet (75 mg total) by mouth 2 (two) times daily., Disp: 60 tablet, Rfl: 2 .  triamterene-hydrochlorothiazide (MAXZIDE) 75-50 MG tablet, Take 1 tablet by mouth every morning., Disp: 90 tablet, Rfl: 1 .  VENTOLIN HFA 108 (90 Base) MCG/ACT inhaler, USE 2 PUFFS EVERY 6 HOURS AS NEEDED, Disp: 18 g, Rfl: 5 Social History   Socioeconomic History  . Marital status: Married    Spouse name: Not on file  . Number of children: Not on file  . Years of education: Not on file  . Highest education level: Not on file  Occupational History  . Not on file  Social Needs  . Financial resource strain: Not on file  .  Food insecurity:    Worry: Not on file    Inability: Not on file  . Transportation needs:    Medical: Not on file    Non-medical: Not on file  Tobacco Use  . Smoking status: Former Smoker    Packs/day: 0.50    Years: 20.00    Pack years: 10.00    Types: Cigarettes    Last attempt to quit: 05/21/2017    Years since quitting: 1.4  . Smokeless tobacco: Never Used  Substance and Sexual Activity  . Alcohol use: Yes    Alcohol/week: 3.0 standard drinks    Types: 3 Cans of beer per week  . Drug use: No  . Sexual activity: Not on file  Lifestyle  . Physical activity:    Days per week: Not on file    Minutes per session: Not on file  . Stress: Not on file  Relationships  .  Social connections:    Talks on phone: Not on file    Gets together: Not on file    Attends religious service: Not on file    Active member of club or organization: Not on file    Attends meetings of clubs or organizations: Not on file    Relationship status: Not on file  . Intimate partner violence:    Fear of current or ex partner: Not on file    Emotionally abused: Not on file    Physically abused: Not on file    Forced sexual activity: Not on file  Other Topics Concern  . Not on file  Social History Narrative  . Not on file   Family History  Problem Relation Age of Onset  . Kidney disease Mother   . Heart attack Mother   . Cancer Father   . Heart attack Father     Objective: Office vital signs reviewed. BP (!) 144/96   Pulse 79   Temp 97.9 F (36.6 C) (Oral)   Ht 5\' 7"  (1.702 m)   Wt 249 lb (112.9 kg)   BMI 39.00 kg/m   Physical Examination:  General: Awake, alert, obese, No acute distress HEENT: Normal, sclera white, MMM Cardio: regular rate and rhythm, S1S2 heard, no murmurs appreciated Pulm: clear to auscultation bilaterally, no wheezes, rhonchi or rales; normal work of breathing on room air Extremities: warm, well perfused, No edema, cyanosis or clubbing; +2 pulses bilaterally MSK: normal gait and station  Assessment/ Plan: 47 y.o. male   1. Essential hypertension BP not at goal.  Ideally, I would like to wean him off of clonidine at some point, as I think that the risk of rebound hypertension outweighs the benefit of use of this medication.  I have increased his Norvasc to 10 mg daily.  He will follow-up with the nurse for blood pressure check in 2 weeks.  We discussed how to check blood pressure at home and a log has been provided to the patient to bring to her next visit in 1 month.  Continue triamterene/hydrochlorothiazide at current dose.  Will consider adding ace inhibitor or ARB versus beta-blocker if needed to further control blood pressure.  Doxazosin  may be a good addition as well given lower urinary tract symptoms that may be related to BPH.  2. Morbid obesity ([place]) Will work on weight loss with patient. - TSH  3. Elevated serum glucose A1c 8.2 indicating new onset T2 DM.  I attempted to call the patient after his visit was over to discuss this result.  Unfortunately, no answer no voicemail.  I will have the nurse reach out to him again tomorrow.  He is to return in 1 month for blood pressure recheck but we should make sure that he has a 30-minute appointment so that we can discuss his new diabetic diagnosis as well.  Referral has been placed to diabetic nutritionist. - Bayer DCA Hb A1c Waived  4. Tobacco abuse Counseling.  Patient has been successful with Chantix in the past.  Have given him a written prescription as well as a coupon and he is to consider restarting this.  I think that if we can get him off of tobacco, his blood pressure would likely improve as well. - varenicline (CHANTIX STARTING MONTH PAK) 0.5 MG X 11 & 1 MG X 42 tablet; Take 0.5 mg tablet by mouth once daily x3 days, then 0.5 mg tablet twice daily x4 days, then increase to one 1 mg tablet twice daily.  Dispense: 53 tablet; Refill: 0 - varenicline (CHANTIX CONTINUING MONTH PAK) 1 MG tablet; Take 1 tablet (1 mg total) by mouth 2 (two) times daily.  Dispense: 60 tablet; Refill: 2  5. Screening for malignant neoplasm of prostate Having lower urinary tract symptoms.  Check PSA. - PSA  6. Decreased urine stream - PSA  7. New onset type 2 diabetes mellitus ([place]) As above.  Plan to add Metformin at next visit. - Referral to Nutrition and Diabetes Services   Orders Placed This Encounter  Procedures  . Bayer DCA Hb A1c Waived  . PSA  . TSH  . Referral to Nutrition and Diabetes Services    Referral Priority:   Routine    Referral Type:   Consultation    Referral Reason:   Specialty Services Required    Number of Visits Requested:   1   Meds ordered this encounter   Medications  . amLODipine (NORVASC) 10 MG tablet    Sig: Take 1 tablet (10 mg total) by mouth daily.    Dispense:  90 tablet    Refill:  0  . varenicline (CHANTIX STARTING MONTH PAK) 0.5 MG X 11 & 1 MG X 42 tablet    Sig: Take 0.5 mg tablet by mouth once daily x3 days, then 0.5 mg tablet twice daily x4 days, then increase to one 1 mg tablet twice daily.    Dispense:  53 tablet    Refill:  0  . varenicline (CHANTIX CONTINUING MONTH PAK) 1 MG tablet    Sig: Take 1 tablet (1 mg total) by mouth 2 (two) times daily.    Dispense:  60 tablet    Refill:  [place], [place] [phone]

## 2018-11-10 NOTE — Patient Instructions (Signed)
For your blood pressure:   Triamterene/hydrochlorothiazide: Take 1 capsule every morning (this already has the medicine that makes you pee in it)  Amlodipine: This has been increased to 10 mg tablets.  You have 5 mg tablets at home.  You may take 2 of the 5 mg tablets you have at home to equal 10 mg daily.  Once you have finished your current bottle, switch to the 10 mg tablets and take 1 tablet daily.  Clonidine 0.1 mg: You may continue taking 2 tablets 2 times per day.  We will plan to gradually wean you off of this medicine and add a different blood pressure medicine since this medicine can result in something called rebound high blood pressure if you miss doses.  We discussed that salt increases your blood pressure as does smoking.  I have given you a written prescription and coupon for Chantix.  We will work on weight loss at future visits.  For now, I would like to get your blood pressure under control.  Your goal blood pressure is less than 140/90.  Monitor your blood pressure at home after you have taken your medicine by at least 1 hour.   Follow-up with the nurse in 2 weeks to have your blood pressure rechecked.  We are checking you for diabetes.  We are checking your prostate level today.  I am also checking your thyroid level.   How to Take Your Blood Pressure You can take your blood pressure at home with a machine. You may need to check your blood pressure at home:  To check if you have high blood pressure (hypertension).  To check your blood pressure over time.  To make sure your blood pressure medicine is working.  Supplies needed: You will need a blood pressure machine, or monitor. You can buy one at a drugstore or online. When choosing one:  Choose one with an arm cuff.  Choose one that wraps around your upper arm. Only one finger should fit between your arm and the cuff.  Do not choose one that measures your blood pressure from your wrist or finger.  Your doctor can  suggest a monitor. How to prepare Avoid these things for 30 minutes before checking your blood pressure:  Drinking caffeine.  Drinking alcohol.  Eating.  Smoking.  Exercising.  Five minutes before checking your blood pressure:  Pee.  Sit in a dining chair. Avoid sitting in a soft couch or armchair.  Be quiet. Do not talk.  How to take your blood pressure Follow the instructions that came with your machine. If you have a digital blood pressure monitor, these may be the instructions: 1. Sit up straight. 2. Place your feet on the floor. Do not cross your ankles or legs. 3. Rest your left arm at the level of your heart. You may rest it on a table, desk, or chair. 4. Pull up your shirt sleeve. 5. Wrap the blood pressure cuff around the upper part of your left arm. The cuff should be 1 inch (2.5 cm) above your elbow. It is best to wrap the cuff around bare skin. 6. Fit the cuff snugly around your arm. You should be able to place only one finger between the cuff and your arm. 7. Put the cord inside the groove of your elbow. 8. Press the power button. 9. Sit quietly while the cuff fills with air and loses air. 10. Write down the numbers on the screen. 11. Wait 2-3 minutes and then repeat  steps 1-10.  What do the numbers mean? Two numbers make up your blood pressure. The first number is called systolic pressure. The second is called diastolic pressure. An example of a blood pressure reading is "120 over 80" (or 120/80). If you are an adult and do not have a medical condition, use this guide to find out if your blood pressure is normal: Normal  First number: below 120.  Second number: below 80. Elevated  First number: 120-129.  Second number: below 80. Hypertension stage 1  First number: 130-139.  Second number: 80-89. Hypertension stage 2  First number: 140 or above.  Second number: 43 or above. Your blood pressure is above normal even if only the top or bottom  number is above normal. Follow these instructions at home:  Check your blood pressure as often as your doctor tells you to.  Take your monitor to your next doctor's appointment. Your doctor will: ? Make sure you are using it correctly. ? Make sure it is working right.  Make sure you understand what your blood pressure numbers should be.  Tell your doctor if your medicines are causing side effects. Contact a doctor if:  Your blood pressure keeps being high. Get help right away if:  Your first blood pressure number is higher than 180.  Your second blood pressure number is higher than 120. This information is not intended to replace advice given to you by your health care provider. Make sure you discuss any questions you have with your health care provider. Document Released: 11/14/2008 Document Revised: 10/30/2016 Document Reviewed: 05/10/2016 Elsevier Interactive Patient Education  [employer].

## 2018-11-10 NOTE — Telephone Encounter (Signed)
Attempted to call the patient with results of his hemoglobin A1c.  This was found to be elevated 8.2 indicating new onset type 2 diabetes.  I am referring him to a diabetic nutritionist and would like to see him back in 1 month for blood pressure recheck and further discussion of the diabetes, sooner if he would like to talk about diet face to face.  Please make sure he is scheduled for a 30 min appt as I would like to make sure we have enough time to discuss this new diagnosis and counsel on diet modifcation.  We may need to consider starting medication if he is unable to achieve reduction of A1c through weight loss and reduction in carbohydrates.    Please refer him to the American Diabetic Association website: https://www.diabetes.org/.  They have excellent information on A1c, nutrition and how to combat diabetes.   Jaymie Misch M. Lajuana Ripple, [facility]

## 2018-11-11 LAB — PSA: Prostate Specific Ag, Serum: 0.3 ng/mL (ref 0.0–4.0)

## 2018-11-11 LAB — TSH: TSH: 2.55 u[IU]/mL (ref 0.450–4.500)

## 2018-11-11 NOTE — Telephone Encounter (Signed)
Patient has a follow up appointment scheduled. 

## 2018-11-23 ENCOUNTER — Encounter: Payer: Self-pay | Admitting: Family Medicine

## 2018-11-23 ENCOUNTER — Ambulatory Visit: Payer: BLUE CROSS/BLUE SHIELD | Admitting: Family Medicine

## 2018-11-23 VITALS — BP 157/90 | HR 90 | Temp 98.1°F | Ht 67.0 in | Wt 245.0 lb

## 2018-11-23 DIAGNOSIS — I10 Essential (primary) hypertension: Secondary | ICD-10-CM | POA: Diagnosis not present

## 2018-11-23 DIAGNOSIS — E1159 Type 2 diabetes mellitus with other circulatory complications: Secondary | ICD-10-CM | POA: Diagnosis not present

## 2018-11-23 DIAGNOSIS — E119 Type 2 diabetes mellitus without complications: Secondary | ICD-10-CM | POA: Diagnosis not present

## 2018-11-23 DIAGNOSIS — I152 Hypertension secondary to endocrine disorders: Secondary | ICD-10-CM

## 2018-11-23 MED ORDER — LISINOPRIL 10 MG PO TABS: 10.0000 mg | ORAL_TABLET | Freq: Every day | ORAL | 1 refills | Status: DC

## 2018-11-23 NOTE — Progress Notes (Signed)
Subjective: CC: DM2, new onset PCP: Janora Norlander, DO David Bartlett is a 47 y.o. male presenting to clinic today for:  1. Type 2 Diabetes, new diagnosis Patient reports that he used his sister's glucometer and his blood sugar was running about 200s.  He has been working on weight loss and diet modification since our discussion about his A1c result.  He is reported about a 6 to 7 pound weight loss in that time.  He is avoiding canned goods and things with high salt content as well to work on blood pressure.  He has plans to start a formal exercise program as well soon.  He would like to proceed with diet modification and exercise prior to starting any medications.  He has completely come off sodas and sugary beverages and replaced with diet drinks.  Family history significant for type 2 diabetes in his older sister and maternal grandmother.  Last A1c: 8.2 in November 2019.  ROS: denies polyuria, polydipsia, unintended weight loss/gain, foot ulcerations, numbness or tingling in extremities or chest pain.  2.  Hypertension Patient reports that since last discussion he has resumed clonidine 0.1 mg p.o. twice daily.  He continues to take triamterene hydrochlorothiazide as well as amlodipine 5 mg.  His blood pressure has started rising after reducing his clonidine dose.  He denies any chest pain, shortness of breath, lower extremity edema, visual disturbance or dizziness.  Again, he started modifying his diet as above to reduce salt intake.  Family history significant for cardiac death in his mother at age 74 and MI in his father at 32 which also resulted in his death.  ROS: Per HPI  Allergies  Allergen Reactions  . Penicillins Other (See Comments)    Under arms swell   Past Medical History:  Diagnosis Date  . Abnormal EKG   . Asthmatic bronchitis   . HTN (hypertension)   . Left breast mass    resolved on its own   . Smoker     Current Outpatient Medications:  .  albuterol  (PROAIR HFA) 108 (90 Base) MCG/ACT inhaler, Inhale 2 puffs into the lungs every 6 (six) hours as needed for wheezing or shortness of breath., Disp: 1 Inhaler, Rfl: 11 .  amLODipine (NORVASC) 10 MG tablet, Take 1 tablet (10 mg total) by mouth daily., Disp: 90 tablet, Rfl: 0 .  aspirin 81 MG chewable tablet, Chew 1 tablet (81 mg total) by mouth 2 (two) times daily. (Patient taking differently: Chew 81 mg by mouth once. ), Disp: 60 tablet, Rfl: 1 .  atorvastatin (LIPITOR) 20 MG tablet, TAKE ONE (1) TABLET EACH DAY, Disp: 90 tablet, Rfl: 1 .  Cetirizine HCl 10 MG TBDP, Take 1 tablet by mouth daily., Disp: , Rfl:  .  cholecalciferol (VITAMIN D) 1000 units tablet, Take 1,000 Units by mouth daily., Disp: , Rfl:  .  cloNIDine (CATAPRES) 0.1 MG tablet, TAKE ONE TABLET BY MOUTH TWICE DAILY (Patient taking differently: Take 0.1 mg by mouth 2 (two) times daily. ), Disp: 180 tablet, Rfl: 1 .  triamterene-hydrochlorothiazide (MAXZIDE) 75-50 MG tablet, Take 1 tablet by mouth every morning., Disp: 90 tablet, Rfl: 1 .  varenicline (CHANTIX CONTINUING MONTH PAK) 1 MG tablet, Take 1 tablet (1 mg total) by mouth 2 (two) times daily., Disp: 60 tablet, Rfl: 2 .  varenicline (CHANTIX STARTING MONTH PAK) 0.5 MG X 11 & 1 MG X 42 tablet, Take 0.5 mg tablet by mouth once daily x3 days, then 0.5 mg  tablet twice daily x4 days, then increase to one 1 mg tablet twice daily., Disp: 53 tablet, Rfl: 0 .  VENTOLIN HFA 108 (90 Base) MCG/ACT inhaler, USE 2 PUFFS EVERY 6 HOURS AS NEEDED, Disp: 18 g, Rfl: 5 Social History   Socioeconomic History  . Marital status: Married    Spouse name: Not on file  . Number of children: Not on file  . Years of education: Not on file  . Highest education level: Not on file  Occupational History  . Not on file  Social Needs  . Financial resource strain: Not on file  . Food insecurity:    Worry: Not on file    Inability: Not on file  . Transportation needs:    Medical: Not on file     Non-medical: Not on file  Tobacco Use  . Smoking status: Current Every Day Smoker    Packs/day: 1.00    Years: 30.00    Pack years: 30.00    Types: Cigarettes    Last attempt to quit: 05/21/2017    Years since quitting: 1.5  . Smokeless tobacco: Never Used  Substance and Sexual Activity  . Alcohol use: Yes    Alcohol/week: 3.0 standard drinks    Types: 3 Cans of beer per week  . Drug use: No  . Sexual activity: Not on file  Lifestyle  . Physical activity:    Days per week: Not on file    Minutes per session: Not on file  . Stress: Not on file  Relationships  . Social connections:    Talks on phone: Not on file    Gets together: Not on file    Attends religious service: Not on file    Active member of club or organization: Not on file    Attends meetings of clubs or organizations: Not on file    Relationship status: Not on file  . Intimate partner violence:    Fear of current or ex partner: Not on file    Emotionally abused: Not on file    Physically abused: Not on file    Forced sexual activity: Not on file  Other Topics Concern  . Not on file  Social History Narrative  . Not on file   Family History  Problem Relation Age of Onset  . Kidney disease Mother   . Heart attack Mother 41       died  . Cancer Father   . Heart attack Father 33       died  . Diabetes Sister   . Diabetes Maternal Grandmother     Objective: Office vital signs reviewed. BP (!) 157/90   Pulse 90   Temp 98.1 F (36.7 C) (Oral)   Ht 5\' 7"  (1.702 m)   Wt 245 lb (111.1 kg)   BMI 38.37 kg/m   Physical Examination:  General: Awake, alert, well nourished, No acute distress HEENT: Normal, sclera white, MMM Cardio: regular rate and rhythm, S1S2 heard, no murmurs appreciated Pulm: clear to auscultation bilaterally, no wheezes, rhonchi or rales; normal work of breathing on room air Extremities: warm, well perfused, No edema, cyanosis or clubbing; +2 pulses bilaterally  Assessment/ Plan: 47  y.o. male   1. New onset type 2 diabetes mellitus ([facility]) Microalbumin creatinine ratio ordered but not obtained prior to patient's departure.  I spent quite a bit of time today discussing diabetes, possibility for metformin if unable to achieve A1c less than 7 through diet and weight loss alone.  We discussed that even diet drinks and sugar replacements could cause abnormalities in insulin levels and they were not recommended.  We reviewed diet quite a bit during today's visit and I have encouraged him to increase vegetables/fruits.  Avoid carbohydrates when able.  High-protein diet encouraged.  Will need to have patient schedule diabetic eye exam, plan for diabetic foot exam at next visit.  He is scheduled to see the diabetic education specialist in January.  I have also provided him a handout with the ADA website.  We will at some point need to have pneumococcal vaccine administered as well.  We will plan to see each other back again in 3 months.  Plan for recheck A1c and other maintenance as above.  Patient seems highly motivated. - Microalbumin / creatinine urine ratio  2. Hypertension associated with diabetes ([place]) Not at goal.  I suspect this is related to abrupt reduction in clonidine dose.  Likely rebound hypertension.  I would like to taper him off of the clonidine, as I think there are better medications available to control blood pressure.  I have initiated lisinopril 10 mg daily.  He has been given taper instructions for the clonidine.  He will return in 1 week for repeat BMP, specifically to check renal function and potassium given concomitant use of triamterene.  His last potassium value was on the low end of normal, so suspect that he will tolerate the medications together without difficulty.  However, if potassium is above normal range this would be an indication to discontinue use of ACE inhibitor versus triamterene.  Per previous conversations, he has not tolerated increased doses of Norvasc.   He will continue to monitor blood pressures at home.  We discussed that his goal blood pressure regardless of age will be 140/90 given history of diabetes. - Basic Metabolic Panel; Future   Orders Placed This Encounter  Procedures  . Microalbumin / creatinine urine ratio  . Basic Metabolic Panel    Standing Status:   Future    Standing Expiration Date:   11/24/2019   Meds ordered this encounter  Medications  . lisinopril (PRINIVIL,ZESTRIL) 10 MG tablet    Sig: Take 1 tablet (10 mg total) by mouth daily.    Dispense:  30 tablet    Refill:  Echo, DO [place] [phone]

## 2018-11-23 NOTE — Patient Instructions (Addendum)
Reduce Clonidine to 1/2 tablet twice daily x4 days  Then decrease to 1/4 tablet twice daily x4 days  Then STOP Clonidine completely   Start Lisinopril 10mg  daily NOW.  Come back in 1 week to have your potassium and kidney function rechecked.  Sometimes taking Triamterene and Lisinopril can cause potassium to go up, so it is very important to have this level rechecked soon.  Monitor blood pressure daily and record.  Your Goal is less than 140/90.  Follow up with me in 3 months to recheck your diabetes.  Go to: American Diabetic Association website: https://www.diabetes.org/   Understanding your Hemoglobin A1c:     Diabetes Mellitus and Nutrition When you have diabetes (diabetes mellitus), it is very important to have healthy eating habits because your blood sugar (glucose) levels are greatly affected by what you eat and drink. Eating healthy foods in the appropriate amounts, at about the same times every day, can help you:  Control your blood glucose.  Lower your risk of heart disease.  Improve your blood pressure.  Reach or maintain a healthy weight.  Every person with diabetes is different, and each person has different needs for a meal plan. Your health care provider may recommend that you work with a diet and nutrition specialist (dietitian) to make a meal plan that is best for you. Your meal plan may vary depending on factors such as:  The calories you need.  The medicines you take.  Your weight.  Your blood glucose, blood pressure, and cholesterol levels.  Your activity level.  Other health conditions you have, such as heart or kidney disease.  How do carbohydrates affect me? Carbohydrates affect your blood glucose level more than any other type of food. Eating carbohydrates naturally increases the amount of glucose in your blood. Carbohydrate counting is a method for keeping track of how many carbohydrates you eat. Counting carbohydrates is important to keep  your blood glucose at a healthy level, especially if you use insulin or take certain oral diabetes medicines. It is important to know how many carbohydrates you can safely have in each meal. This is different for every person. Your dietitian can help you calculate how many carbohydrates you should have at each meal and for snack. Foods that contain carbohydrates include:  Bread, cereal, rice, pasta, and crackers.  Potatoes and corn.  Peas, beans, and lentils.  Milk and yogurt.  Fruit and juice.  Desserts, such as cakes, cookies, ice cream, and candy.  How does alcohol affect me? Alcohol can cause a sudden decrease in blood glucose (hypoglycemia), especially if you use insulin or take certain oral diabetes medicines. Hypoglycemia can be a life-threatening condition. Symptoms of hypoglycemia (sleepiness, dizziness, and confusion) are similar to symptoms of having too much alcohol. If your health care provider says that alcohol is safe for you, follow these guidelines:  Limit alcohol intake to no more than 1 drink per day for nonpregnant women and 2 drinks per day for men. One drink equals 12 oz of beer, 5 oz of wine, or 1 oz of hard liquor.  Do not drink on an empty stomach.  Keep yourself hydrated with water, diet soda, or unsweetened iced tea.  Keep in mind that regular soda, juice, and other mixers may contain a lot of sugar and must be counted as carbohydrates.  What are tips for following this plan? Reading food labels  Start by checking the serving size on the label. The amount of calories, carbohydrates, fats, and  other nutrients listed on the label are based on one serving of the food. Many foods contain more than one serving per package.  Check the total grams (g) of carbohydrates in one serving. You can calculate the number of servings of carbohydrates in one serving by dividing the total carbohydrates by 15. For example, if a food has 30 g of total carbohydrates, it would  be equal to 2 servings of carbohydrates.  Check the number of grams (g) of saturated and trans fats in one serving. Choose foods that have low or no amount of these fats.  Check the number of milligrams (mg) of sodium in one serving. Most people should limit total sodium intake to less than 2,300 mg per day.  Always check the nutrition information of foods labeled as "low-fat" or "nonfat". These foods may be higher in added sugar or refined carbohydrates and should be avoided.  Talk to your dietitian to identify your daily goals for nutrients listed on the label. Shopping  Avoid buying canned, premade, or processed foods. These foods tend to be high in fat, sodium, and added sugar.  Shop around the outside edge of the grocery store. This includes fresh fruits and vegetables, bulk grains, fresh meats, and fresh dairy. Cooking  Use low-heat cooking methods, such as baking, instead of high-heat cooking methods like deep frying.  Cook using healthy oils, such as olive, canola, or sunflower oil.  Avoid cooking with butter, cream, or high-fat meats. Meal planning  Eat meals and snacks regularly, preferably at the same times every day. Avoid going long periods of time without eating.  Eat foods high in fiber, such as fresh fruits, vegetables, beans, and whole grains. Talk to your dietitian about how many servings of carbohydrates you can eat at each meal.  Eat 4-6 ounces of lean protein each day, such as lean meat, chicken, fish, eggs, or tofu. 1 ounce is equal to 1 ounce of meat, chicken, or fish, 1 egg, or 1/4 cup of tofu.  Eat some foods each day that contain healthy fats, such as avocado, nuts, seeds, and fish. Lifestyle   Check your blood glucose regularly.  Exercise at least 30 minutes 5 or more days each week, or as told by your health care provider.  Take medicines as told by your health care provider.  Do not use any products that contain nicotine or tobacco, such as  cigarettes and e-cigarettes. If you need help quitting, ask your health care provider.  Work with a Social worker or diabetes educator to identify strategies to manage stress and any emotional and social challenges. What are some questions to ask my health care provider?  Do I need to meet with a diabetes educator?  Do I need to meet with a dietitian?  What number can I call if I have questions?  When are the best times to check my blood glucose? Where to find more information:  American Diabetes Association: diabetes.org/food-and-fitness/food  Academy of Nutrition and Dietetics: PokerClues.dk  [employer] of Diabetes and Digestive and Kidney Diseases (NIH): ContactWire.be Summary  A healthy meal plan will help you control your blood glucose and maintain a healthy lifestyle.  Working with a diet and nutrition specialist (dietitian) can help you make a meal plan that is best for you.  Keep in mind that carbohydrates and alcohol have immediate effects on your blood glucose levels. It is important to count carbohydrates and to use alcohol carefully. This information is not intended to replace advice given to  you by your health care provider. Make sure you discuss any questions you have with your health care provider. Document Released: 08/29/2005 Document Revised: 01/06/2017 Document Reviewed: 01/06/2017 Elsevier Interactive Patient Education  [employer].

## 2018-11-24 ENCOUNTER — Encounter: Payer: Self-pay | Admitting: Family Medicine

## 2018-11-30 ENCOUNTER — Ambulatory Visit: Payer: BLUE CROSS/BLUE SHIELD | Admitting: *Deleted

## 2018-11-30 DIAGNOSIS — E1159 Type 2 diabetes mellitus with other circulatory complications: Secondary | ICD-10-CM

## 2018-11-30 DIAGNOSIS — I10 Essential (primary) hypertension: Secondary | ICD-10-CM

## 2018-11-30 DIAGNOSIS — Z0130 Encounter for examination of blood pressure without abnormal findings: Secondary | ICD-10-CM

## 2018-11-30 DIAGNOSIS — I152 Hypertension secondary to endocrine disorders: Secondary | ICD-10-CM

## 2018-11-30 NOTE — Progress Notes (Signed)
Pt here for BP check BP 125 75 P 95

## 2018-11-30 NOTE — Addendum Note (Signed)
Addended by: Liliane Bade on: 11/30/2018 04:27 PM   Modules accepted: Orders

## 2018-12-01 LAB — BASIC METABOLIC PANEL
BUN/Creatinine Ratio: 15 (ref 9–20)
BUN: 13 mg/dL (ref 6–24)
CO2: 25 mmol/L (ref 20–29)
CREATININE: 0.84 mg/dL (ref 0.76–1.27)
Calcium: 9.4 mg/dL (ref 8.7–10.2)
Chloride: 95 mmol/L — ABNORMAL LOW (ref 96–106)
GFR, EST AFRICAN AMERICAN: 120 mL/min/{1.73_m2} (ref >59)
GFR, EST NON AFRICAN AMERICAN: 104 mL/min/{1.73_m2} (ref >59)
Glucose: 116 mg/dL — ABNORMAL HIGH (ref 65–99)
Potassium: 4.2 mmol/L (ref 3.5–5.2)
SODIUM: 134 mmol/L (ref 134–144)

## 2018-12-03 ENCOUNTER — Other Ambulatory Visit: Payer: Self-pay | Admitting: Family Medicine

## 2018-12-22 ENCOUNTER — Encounter: Payer: BLUE CROSS/BLUE SHIELD | Attending: Family Medicine | Admitting: Dietician

## 2018-12-22 ENCOUNTER — Encounter: Payer: Self-pay | Admitting: Dietician

## 2018-12-22 DIAGNOSIS — E119 Type 2 diabetes mellitus without complications: Secondary | ICD-10-CM

## 2018-12-22 NOTE — Progress Notes (Signed)
Patient was seen on 12/22/2018 for the first of a series of three diabetes self-management courses at the [facility].  Patient Education Plan per assessed needs and concerns is to attend three course education program for Diabetes Self Management Education.  The following learning objectives were met by the patient during this class:  Describe diabetes  State some common risk factors for diabetes  Defines the role of glucose and insulin  Identifies type of diabetes and pathophysiology  Describe the relationship between diabetes and cardiovascular risk  State the members of the Healthcare Team  States the rationale for glucose monitoring  State when to test glucose  State their individual Target Range  State the importance of logging glucose readings  Describe how to interpret glucose readings  Identifies A1C target  Explain the correlation between A1c and eAG values  State symptoms and treatment of high blood glucose  State symptoms and treatment of low blood glucose  Explain proper technique for glucose testing  Identifies proper sharps disposal  Handouts given during class include:  ADA Diabetes You Take Control   Carb Counting and Meal Planning book  Meal Plan Card  Meal planning worksheet  Low Sodium Flavoring Tips  Types of Fats  The diabetes portion plate  A1c to eAG Conversion Chart  Diabetes Recommended Care Schedule  Support Group  Diabetes Success Plan  Core Class Satisfaction Survey   Follow-Up Plan:  Attend core 2   

## 2018-12-29 ENCOUNTER — Encounter: Payer: Self-pay | Admitting: Dietician

## 2018-12-29 ENCOUNTER — Encounter: Payer: BLUE CROSS/BLUE SHIELD | Admitting: Dietician

## 2018-12-29 DIAGNOSIS — E119 Type 2 diabetes mellitus without complications: Secondary | ICD-10-CM

## 2018-12-29 NOTE — Progress Notes (Signed)
Patient was seen on 12/29/2018 for the second of a series of three diabetes self-management courses at the [facility]. The following learning objectives were met by the patient during this class:   Describe the role of different macronutrients on glucose  Explain how carbohydrates affect blood glucose  State what foods contain the most carbohydrates  Demonstrate carbohydrate counting  Demonstrate how to read Nutrition Facts food label  Describe effects of various fats on heart health  Describe the importance of good nutrition for health and healthy eating strategies  Describe techniques for managing your shopping, cooking and meal planning  List strategies to follow meal plan when dining out  Describe the effects of alcohol on glucose and how to use it safely  Goals:  Follow Diabetes Meal Plan as instructed  Aim to spread carbs evenly throughout the day  Aim for 3 meals per day and snacks as needed Include lean protein foods to meals/snacks  Monitor glucose levels as instructed by your doctor   Follow-Up Plan:  Attend Core 3  Work towards following your personal food plan.   

## 2019-01-05 ENCOUNTER — Ambulatory Visit: Payer: BLUE CROSS/BLUE SHIELD

## 2019-02-02 ENCOUNTER — Ambulatory Visit: Payer: BLUE CROSS/BLUE SHIELD | Admitting: Family Medicine

## 2019-02-22 ENCOUNTER — Encounter: Payer: Self-pay | Admitting: Family Medicine

## 2019-02-22 ENCOUNTER — Ambulatory Visit: Payer: Self-pay | Admitting: Family Medicine

## 2019-02-22 VITALS — BP 137/85 | HR 81 | Temp 97.4°F | Ht 67.0 in | Wt 239.0 lb

## 2019-02-22 DIAGNOSIS — E785 Hyperlipidemia, unspecified: Secondary | ICD-10-CM

## 2019-02-22 DIAGNOSIS — E119 Type 2 diabetes mellitus without complications: Secondary | ICD-10-CM

## 2019-02-22 DIAGNOSIS — I10 Essential (primary) hypertension: Secondary | ICD-10-CM

## 2019-02-22 DIAGNOSIS — E1159 Type 2 diabetes mellitus with other circulatory complications: Secondary | ICD-10-CM

## 2019-02-22 DIAGNOSIS — I152 Hypertension secondary to endocrine disorders: Secondary | ICD-10-CM

## 2019-02-22 DIAGNOSIS — E1169 Type 2 diabetes mellitus with other specified complication: Secondary | ICD-10-CM

## 2019-02-22 MED ORDER — AMLODIPINE BESYLATE 10 MG PO TABS: 10.0000 mg | ORAL_TABLET | Freq: Every day | ORAL | 2 refills | Status: DC

## 2019-02-22 MED ORDER — LISINOPRIL 10 MG PO TABS: ORAL_TABLET | ORAL | 2 refills | Status: DC

## 2019-02-22 MED ORDER — ATORVASTATIN CALCIUM 20 MG PO TABS: ORAL_TABLET | ORAL | 2 refills | Status: DC

## 2019-02-22 MED ORDER — TRIAMTERENE-HCTZ 75-50 MG PO TABS: 1.0000 | ORAL_TABLET | Freq: Every morning | ORAL | 2 refills | Status: DC

## 2019-02-22 NOTE — Patient Instructions (Signed)
I have refilled your medications.  I want to check labs at our next visit if we can.  If you spot check your blood sugar, your fasting goal is less than 120.  Plan to see me again in 3 months.  Keep up the good work with weight loss and carbohydrate reduction.  Your foot exam showed a callous today.  I want you to keep an eye on it.   Diabetes Mellitus and Foot Care Foot care is an important part of your health, especially when you have diabetes. Diabetes may cause you to have problems because of poor blood flow (circulation) to your feet and legs, which can cause your skin to:  Become thinner and drier.  Break more easily.  Heal more slowly.  Peel and crack. You may also have nerve damage (neuropathy) in your legs and feet, causing decreased feeling in them. This means that you may not notice minor injuries to your feet that could lead to more serious problems. Noticing and addressing any potential problems early is the best way to prevent future foot problems. How to care for your feet Foot hygiene  Wash your feet daily with warm water and mild soap. Do not use hot water. Then, pat your feet and the areas between your toes until they are completely dry. Do not soak your feet as this can dry your skin.  Trim your toenails straight across. Do not dig under them or around the cuticle. File the edges of your nails with an emery board or nail file.  Apply a moisturizing lotion or petroleum jelly to the skin on your feet and to dry, brittle toenails. Use lotion that does not contain alcohol and is unscented. Do not apply lotion between your toes. Shoes and socks  Wear clean socks or stockings every day. Make sure they are not too tight. Do not wear knee-high stockings since they may decrease blood flow to your legs.  Wear shoes that fit properly and have enough cushioning. Always look in your shoes before you put them on to be sure there are no objects inside.  To break in new shoes,  wear them for just a few hours a day. This prevents injuries on your feet. Wounds, scrapes, corns, and calluses  Check your feet daily for blisters, cuts, bruises, sores, and redness. If you cannot see the bottom of your feet, use a mirror or ask someone for help.  Do not cut corns or calluses or try to remove them with medicine.  If you find a minor scrape, cut, or break in the skin on your feet, keep it and the skin around it clean and dry. You may clean these areas with mild soap and water. Do not clean the area with peroxide, alcohol, or iodine.  If you have a wound, scrape, corn, or callus on your foot, look at it several times a day to make sure it is healing and not infected. Check for: ? Redness, swelling, or pain. ? Fluid or blood. ? Warmth. ? Pus or a bad smell. General instructions  Do not cross your legs. This may decrease blood flow to your feet.  Do not use heating pads or hot water bottles on your feet. They may burn your skin. If you have lost feeling in your feet or legs, you may not know this is happening until it is too late.  Protect your feet from hot and cold by wearing shoes, such as at the beach or on hot pavement.  Schedule a complete foot exam at least once a year (annually) or more often if you have foot problems. If you have foot problems, report any cuts, sores, or bruises to your health care provider immediately. Contact a health care provider if:  You have a medical condition that increases your risk of infection and you have any cuts, sores, or bruises on your feet.  You have an injury that is not healing.  You have redness on your legs or feet.  You feel burning or tingling in your legs or feet.  You have pain or cramps in your legs and feet.  Your legs or feet are numb.  Your feet always feel cold.  You have pain around a toenail. Get help right away if:  You have a wound, scrape, corn, or callus on your foot and: ? You have pain, swelling,  or redness that gets worse. ? You have fluid or blood coming from the wound, scrape, corn, or callus. ? Your wound, scrape, corn, or callus feels warm to the touch. ? You have pus or a bad smell coming from the wound, scrape, corn, or callus. ? You have a fever. ? You have a red line going up your leg. Summary  Check your feet every day for cuts, sores, red spots, swelling, and blisters.  Moisturize feet and legs daily.  Wear shoes that fit properly and have enough cushioning.  If you have foot problems, report any cuts, sores, or bruises to your health care provider immediately.  Schedule a complete foot exam at least once a year (annually) or more often if you have foot problems. This information is not intended to replace advice given to you by your health care provider. Make sure you discuss any questions you have with your health care provider. Document Released: 11/29/2000 Document Revised: 01/14/2018 Document Reviewed: 01/03/2017 Elsevier Interactive Patient Education  2019 [employer].

## 2019-02-22 NOTE — Progress Notes (Signed)
Subjective: CC: DM2, HTN, HLD PCP: David Norlander, David Bartlett PFX:[id number] David Bartlett is a 48 y.o. male presenting to clinic today for:  1. Type 2 Diabetes w/ HTN and hyperlipidemia Patient reports he has done quite a bit of diet modification.  He has greatly reduced carbohydrates and in fact totally eliminated carbohydrates initially with quite a bit of weight loss.  After he saw the dietitian, he started adding back small amounts of carbohydrates and has since gained some weight back but overall is down from his previous check.  He has since lost his insurance and would like to defer any labs if possible today.  He reports compliance with Maxide 75/50, lisinopril 10 mg daily, Norvasc 10 mg daily.  He is feeling much better now that he has discontinued use of the clonidine.  He is taking the Lipitor daily as directed.  Last A1c:  Lab Results  Component Value Date   HGBA1C 8.2 (H) 11/10/2018   Nephropathy screen indicated?: no on ACE-I Last flu, zoster and/or pneumovax: Would like to defer pneumococcal vaccination since he is now uninsured  ROS: denies dizziness, LOC, polyuria, polydipsia, unintended weight loss/gain, foot ulcerations, numbness or tingling in extremities or chest pain.  No headaches.   Allergies  Allergen Reactions  . Penicillins Other (See Comments)    Under arms swell   Past Medical History:  Diagnosis Date  . Abnormal EKG   . Asthmatic bronchitis   . Diabetes mellitus without complication ([place])   . HTN (hypertension)   . Left breast mass    resolved on its own   . Smoker     Current Outpatient Medications:  .  albuterol (PROAIR HFA) 108 (90 Base) MCG/ACT inhaler, Inhale 2 puffs into the lungs every 6 (six) hours as needed for wheezing or shortness of breath., Disp: 1 Inhaler, Rfl: 11 .  amLODipine (NORVASC) 10 MG tablet, Take 1 tablet (10 mg total) by mouth daily., Disp: 90 tablet, Rfl: 2 .  aspirin 81 MG chewable tablet, Chew 1 tablet (81 mg total) by mouth 2  (two) times daily. (Patient taking differently: Chew 81 mg by mouth once. ), Disp: 60 tablet, Rfl: 1 .  atorvastatin (LIPITOR) 20 MG tablet, TAKE ONE (1) TABLET EACH DAY, Disp: 90 tablet, Rfl: 2 .  Cetirizine HCl 10 MG TBDP, Take 1 tablet by mouth daily., Disp: , Rfl:  .  cholecalciferol (VITAMIN D) 1000 units tablet, Take 1,000 Units by mouth daily., Disp: , Rfl:  .  lisinopril (PRINIVIL,ZESTRIL) 10 MG tablet, TAKE ONE (1) TABLET EACH DAY, Disp: 90 tablet, Rfl: 2 .  triamterene-hydrochlorothiazide (MAXZIDE) 75-50 MG tablet, Take 1 tablet by mouth every morning., Disp: 90 tablet, Rfl: 2 .  VENTOLIN HFA 108 (90 Base) MCG/ACT inhaler, USE 2 PUFFS EVERY 6 HOURS AS NEEDED, Disp: 18 g, Rfl: 5 .  vitamin C (ASCORBIC ACID) 250 MG tablet, Take 250 mg by mouth daily., Disp: , Rfl:  Social History   Socioeconomic History  . Marital status: Married    Spouse name: Not on file  . Number of children: Not on file  . Years of education: Not on file  . Highest education level: Not on file  Occupational History  . Not on file  Social Needs  . Financial resource strain: Not on file  . Food insecurity:    Worry: Not on file    Inability: Not on file  . Transportation needs:    Medical: Not on file    Non-medical:  Not on file  Tobacco Use  . Smoking status: Current Every Day Smoker    Packs/day: 1.00    Years: 30.00    Pack years: 30.00    Types: Cigarettes    Last attempt to quit: 05/21/2017    Years since quitting: 1.7  . Smokeless tobacco: Never Used  Substance and Sexual Activity  . Alcohol use: Yes    Alcohol/week: 3.0 standard drinks    Types: 3 Cans of beer per week  . Drug use: No  . Sexual activity: Not on file  Lifestyle  . Physical activity:    Days per week: Not on file    Minutes per session: Not on file  . Stress: Not on file  Relationships  . Social connections:    Talks on phone: Not on file    Gets together: Not on file    Attends religious service: Not on file    Active  member of club or organization: Not on file    Attends meetings of clubs or organizations: Not on file    Relationship status: Not on file  . Intimate partner violence:    Fear of current or ex partner: Not on file    Emotionally abused: Not on file    Physically abused: Not on file    Forced sexual activity: Not on file  Other Topics Concern  . Not on file  Social History Narrative  . Not on file   Family History  Problem Relation Age of Onset  . Kidney disease Mother   . Heart attack Mother 30       died  . Cancer Father   . Heart attack Father 25       died  . Diabetes Sister   . Diabetes Maternal Grandmother     Objective: Office vital signs reviewed. BP 137/85   Pulse 81   Temp (!) 97.4 F (36.3 C) (Oral)   Ht '5\' 7"'  (1.702 m)   Wt 239 lb (108.4 kg)   BMI 37.43 kg/m   Physical Examination:  General: Awake, alert, well nourished, No acute distress HEENT: Normal, sclera white, MMM Cardio: regular rate and rhythm, S1S2 heard, no murmurs appreciated Pulm: clear to auscultation bilaterally, no wheezes, rhonchi or rales; normal work of breathing on room air Extremities: warm, well perfused, No edema, cyanosis or clubbing; +2 pulses bilaterally Neuro: DM foot as below  Diabetic Foot Exam - Simple   Simple Foot Form Diabetic Foot exam was performed with the following findings:  Yes 02/22/2019  4:45 PM  Visual Inspection See comments:  Yes Sensation Testing Intact to touch and monofilament testing bilaterally:  Yes Pulse Check Posterior Tibialis and Dorsalis pulse intact bilaterally:  Yes Comments He has quite a bit of callus formation along the left medial plantar aspect of the great toe.  There is no ulceration.  He has onychomycotic changes to all toenails.  Vibratory sensation intact    Assessment/ Plan: 48 y.o. male   1. Hypertension associated with diabetes ([place]) Under good control with new regimen.  Refills have been sent to his pharmacy.  Could consider  reducing triamterene/hydrochlorothiazide at some point given 50 mg dose of hydrochlorothiazide.  Though at this point, renal function seems to be appropriate so it does not seem to be impacting him greatly at this time.  We will plan to recheck BMP at next visit along with hemoglobin A1c.  Hopefully at that time, there will be no financial limitations to laboratory work.  Overall, he seems to be doing well.  2. Type 2 diabetes mellitus without complication, without long-term current use of insulin ([place]) He has had several pound weight loss since last visit.  I have asked him to spot check his blood sugars fasting with goal to be less than 120.  No visual disturbance or any other worrisome symptoms or signs.  Foot exam was performed today and did demonstrate a callus along the medial aspect of the left great toe.  Have asked him to keep an eye on this.  We will plan for diabetic shoes at some point if this is a persistent issue. - Bayer DCA Hb A1c Waived  3. Hyperlipidemia associated with type 2 diabetes mellitus ([place]) He is overdue for fasting labs but we will defer this until next visit.    Orders Placed This Encounter  Procedures  . Lipid Panel    Standing Status:   Future    Standing Expiration Date:   02/22/2020  . Bayer DCA Hb A1c Waived    Standing Status:   Future    Standing Expiration Date:   02/22/2020  . [id number]    Standing Status:   Future    Standing Expiration Date:   02/22/2020   Meds ordered this encounter  Medications  . amLODipine (NORVASC) 10 MG tablet    Sig: Take 1 tablet (10 mg total) by mouth daily.    Dispense:  90 tablet    Refill:  2  . atorvastatin (LIPITOR) 20 MG tablet    Sig: TAKE ONE (1) TABLET EACH DAY    Dispense:  90 tablet    Refill:  2  . lisinopril (PRINIVIL,ZESTRIL) 10 MG tablet    Sig: TAKE ONE (1) TABLET EACH DAY    Dispense:  90 tablet    Refill:  2  . triamterene-hydrochlorothiazide (MAXZIDE) 75-50 MG tablet    Sig: Take 1 tablet by mouth  every morning.    Dispense:  90 tablet    Refill:  2    Additionally, he mentioned he had some pain with left lower extremity raise.  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  David Norlander, David Bartlett [place] [phone]

## 2019-05-03 ENCOUNTER — Telehealth: Payer: Self-pay | Admitting: Family Medicine

## 2019-05-04 ENCOUNTER — Other Ambulatory Visit: Payer: Self-pay | Admitting: Family Medicine

## 2019-05-18 ENCOUNTER — Telehealth: Payer: Self-pay | Admitting: Family Medicine

## 2019-07-06 ENCOUNTER — Encounter: Payer: Self-pay | Admitting: Family Medicine

## 2019-07-15 ENCOUNTER — Other Ambulatory Visit: Payer: Self-pay | Admitting: Physician Assistant

## 2019-08-11 ENCOUNTER — Ambulatory Visit: Payer: PRIVATE HEALTH INSURANCE | Admitting: Family Medicine

## 2019-08-11 ENCOUNTER — Encounter: Payer: Self-pay | Admitting: Family Medicine

## 2019-08-11 ENCOUNTER — Other Ambulatory Visit: Payer: Self-pay

## 2019-08-11 VITALS — BP 146/90 | HR 80 | Temp 99.2°F | Ht 67.0 in | Wt 226.0 lb

## 2019-08-11 DIAGNOSIS — E1169 Type 2 diabetes mellitus with other specified complication: Secondary | ICD-10-CM

## 2019-08-11 DIAGNOSIS — E1159 Type 2 diabetes mellitus with other circulatory complications: Secondary | ICD-10-CM | POA: Diagnosis not present

## 2019-08-11 DIAGNOSIS — E119 Type 2 diabetes mellitus without complications: Secondary | ICD-10-CM | POA: Diagnosis not present

## 2019-08-11 DIAGNOSIS — Z720 Tobacco use: Secondary | ICD-10-CM | POA: Diagnosis not present

## 2019-08-11 DIAGNOSIS — E785 Hyperlipidemia, unspecified: Secondary | ICD-10-CM

## 2019-08-11 DIAGNOSIS — I10 Essential (primary) hypertension: Secondary | ICD-10-CM

## 2019-08-11 DIAGNOSIS — I152 Hypertension secondary to endocrine disorders: Secondary | ICD-10-CM

## 2019-08-11 LAB — BAYER DCA HB A1C WAIVED: HB A1C (BAYER DCA - WAIVED): 5.7 % (ref <7.0)

## 2019-08-11 MED ORDER — ATORVASTATIN CALCIUM 20 MG PO TABS: ORAL_TABLET | ORAL | 3 refills | Status: DC

## 2019-08-11 MED ORDER — LISINOPRIL 10 MG PO TABS: ORAL_TABLET | ORAL | 3 refills | Status: AC

## 2019-08-11 MED ORDER — TRIAMTERENE-HCTZ 75-50 MG PO TABS: 1.0000 | ORAL_TABLET | Freq: Every morning | ORAL | 3 refills | Status: AC

## 2019-08-11 MED ORDER — AMLODIPINE BESYLATE 10 MG PO TABS: 10.0000 mg | ORAL_TABLET | Freq: Every day | ORAL | 3 refills | Status: AC

## 2019-08-11 NOTE — Patient Instructions (Addendum)
Schedule your diabetic eye exam.  Dr Marin Comment at [facility] might be able to help you. You declined your pneumonia shot today. Flu shots will be available in October.  Please schedule your flu shot.  Your sugar is DIET CONTROLLED.  This is great!!  Keep up the good work.    Next, work on stopping smoking.  Diabetes Mellitus and Standards of Medical Care Managing diabetes (diabetes mellitus) can be complicated. Your diabetes treatment may be managed by a team of health care providers, including:  A physician who specializes in diabetes (endocrinologist).  A nurse practitioner or physician assistant.  Nurses.  A diet and nutrition specialist (registered dietitian).  A certified diabetes educator (CDE).  An exercise specialist.  A pharmacist.  An eye doctor.  A foot specialist (podiatrist).  A dentist.  A primary care provider.  A mental health provider. Your health care providers follow guidelines to help you get the best quality of care. The following schedule is a general guideline for your diabetes management plan. Your health care providers may give you more specific instructions. Physical exams Upon being diagnosed with diabetes mellitus, and each year after that, your health care provider will ask about your medical and family history. He or she will also do a physical exam. Your exam may include:  Measuring your height, weight, and body mass index (BMI).  Checking your blood pressure. This will be done at every routine medical visit. Your target blood pressure may vary depending on your medical conditions, your age, and other factors.  Thyroid gland exam.  Skin exam.  Screening for damage to your nerves (peripheral neuropathy). This may include checking the pulse in your legs and feet and checking the level of sensation in your hands and feet.  A complete foot exam to inspect the structure and skin of your feet, including checking for cuts, bruises, redness, blisters,  sores, or other problems.  Screening for blood vessel (vascular) problems, which may include checking the pulse in your legs and feet and checking your temperature. Blood tests Depending on your treatment plan and your personal needs, you may have the following tests done:  HbA1c (hemoglobin A1c). This test provides information about blood sugar (glucose) control over the previous 2-3 months. It is used to adjust your treatment plan, if needed. This test will be done: ? At least 2 times a year, if you are meeting your treatment goals. ? 4 times a year, if you are not meeting your treatment goals or if treatment goals have changed.  Lipid testing, including total, LDL, and HDL cholesterol and triglyceride levels. ? The goal for LDL is less than 100 mg/dL (5.5 mmol/L). If you are at high risk for complications, the goal is less than 70 mg/dL (3.9 mmol/L). ? The goal for HDL is 40 mg/dL (2.2 mmol/L) or higher for men and 50 mg/dL (2.8 mmol/L) or higher for women. An HDL cholesterol of 60 mg/dL (3.3 mmol/L) or higher gives some protection against heart disease. ? The goal for triglycerides is less than 150 mg/dL (8.3 mmol/L).  Liver function tests.  Kidney function tests.  Thyroid function tests. Dental and eye exams  Visit your dentist two times a year.  If you have type 1 diabetes, your health care provider may recommend an eye exam 3-5 years after you are diagnosed, and then once a year after your first exam. ? For children with type 1 diabetes, a health care provider may recommend an eye exam when your child is  age 42 or older and has had diabetes for 3-5 years. After the first exam, your child should get an eye exam once a year.  If you have type 2 diabetes, your health care provider may recommend an eye exam as soon as you are diagnosed, and then once a year after your first exam. Immunizations   The yearly flu (influenza) vaccine is recommended for everyone 6 months or older who has  diabetes.  The pneumonia (pneumococcal) vaccine is recommended for everyone 2 years or older who has diabetes. If you are 6 or older, you may get the pneumonia vaccine as a series of two separate shots.  The hepatitis B vaccine is recommended for adults shortly after being diagnosed with diabetes.  Adults and children with diabetes should receive all other vaccines according to age-specific recommendations from the Centers for Disease Control and Prevention (CDC). Mental and emotional health Screening for symptoms of eating disorders, anxiety, and depression is recommended at the time of diagnosis and afterward as needed. If your screening shows that you have symptoms (positive screening result), you may need more evaluation and you may work with a mental health care provider. Treatment plan Your treatment plan will be reviewed at every medical visit. You and your health care provider will discuss:  How you are taking your medicines, including insulin.  Any side effects you are experiencing.  Your blood glucose target goals.  The frequency of your blood glucose monitoring.  Lifestyle habits, such as activity level as well as tobacco, alcohol, and substance use. Diabetes self-management education Your health care provider will assess how well you are monitoring your blood glucose levels and whether you are taking your insulin correctly. He or she may refer you to:  A certified diabetes educator to manage your diabetes throughout your life, starting at diagnosis.  A registered dietitian who can create or review your personal nutrition plan.  An exercise specialist who can discuss your activity level and exercise plan. Summary  Managing diabetes (diabetes mellitus) can be complicated. Your diabetes treatment may be managed by a team of health care providers.  Your health care providers follow guidelines in order to help you get the best quality of care.  Standards of care including  having regular physical exams, blood tests, blood pressure monitoring, immunizations, screening tests, and education about how to manage your diabetes.  Your health care providers may also give you more specific instructions based on your individual health. This information is not intended to replace advice given to you by your health care provider. Make sure you discuss any questions you have with your health care provider. Document Released: 09/29/2009 Document Revised: 08/21/2018 Document Reviewed: 08/30/2016 Elsevier Patient Education  2020 [employer].

## 2019-08-11 NOTE — Addendum Note (Signed)
Addended by: Earlene Plater on: 08/11/2019 01:32 PM   Modules accepted: Orders

## 2019-08-11 NOTE — Progress Notes (Signed)
Subjective: CC: DM2, HTN, HLD PCP: Janora Norlander, DO [id number] David Bartlett is a 48 y.o. male presenting to clinic today for:  1. Type 2 Diabetes w/ HTN and hyperlipidemia; stress at work ; tobacco use disorder Patient reports he has continued to work on weight loss with carb modification and increase physical activity.  He does report increased stress secondary to being furloughed from his job.  He is "in a limbo".  He however has maintained his lifestyle modification with goal to get down to 200 pounds.  He does continue to smoke daily and identifies this as something that he needs to work on.  Last eye exam: due Last foot exam: UTD Last A1c:  Lab Results  Component Value Date   HGBA1C 8.2 (H) 11/10/2018   Nephropathy screen indicated?: on ACE-I Last flu, zoster and/or pneumovax:  There is no immunization history on file for this patient.  ROS: Denies chest pain, shortness of breath, lower extremity edema, dizziness, visual disturbance, polyuria, polydipsia or sensation changes in the periphery.  Last A1c:  Lab Results  Component Value Date   HGBA1C 8.2 (H) 11/10/2018   Nephropathy screen indicated?: no on ACE-I Last flu, zoster and/or pneumovax: Would like to defer pneumococcal vaccination since he is now uninsured  ROS: denies dizziness, LOC, polyuria, polydipsia, unintended weight loss/gain, foot ulcerations, numbness or tingling in extremities or chest pain.  No headaches.   Allergies  Allergen Reactions  . Penicillins Other (See Comments)    Under arms swell   Past Medical History:  Diagnosis Date  . Abnormal EKG   . Asthmatic bronchitis   . Diabetes mellitus without complication ([name])   . HTN (hypertension)   . Left breast mass    resolved on its own   . Smoker     Current Outpatient Medications:  .  albuterol (VENTOLIN HFA) 108 (90 Base) MCG/ACT inhaler, USE 2 PUFFS EVERY 6 HOURS AS NEEDED, Disp: 18 g, Rfl: 0 .  amLODipine (NORVASC) 10 MG tablet,  Take 1 tablet (10 mg total) by mouth daily., Disp: 90 tablet, Rfl: 2 .  aspirin 81 MG chewable tablet, Chew 1 tablet (81 mg total) by mouth 2 (two) times daily. (Patient taking differently: Chew 81 mg by mouth once. ), Disp: 60 tablet, Rfl: 1 .  atorvastatin (LIPITOR) 20 MG tablet, TAKE ONE (1) TABLET EACH DAY, Disp: 90 tablet, Rfl: 2 .  Cetirizine HCl 10 MG TBDP, Take 1 tablet by mouth daily., Disp: , Rfl:  .  cholecalciferol (VITAMIN D) 1000 units tablet, Take 1,000 Units by mouth daily., Disp: , Rfl:  .  lisinopril (PRINIVIL,ZESTRIL) 10 MG tablet, TAKE ONE (1) TABLET EACH DAY, Disp: 90 tablet, Rfl: 2 .  triamterene-hydrochlorothiazide (MAXZIDE) 75-50 MG tablet, Take 1 tablet by mouth every morning., Disp: 90 tablet, Rfl: 2 .  VENTOLIN HFA 108 (90 Base) MCG/ACT inhaler, USE 2 PUFFS EVERY 6 HOURS AS NEEDED, Disp: 18 g, Rfl: 5 .  vitamin C (ASCORBIC ACID) 250 MG tablet, Take 250 mg by mouth daily., Disp: , Rfl:  Social History   Socioeconomic History  . Marital status: Married    Spouse name: Not on file  . Number of children: Not on file  . Years of education: Not on file  . Highest education level: Not on file  Occupational History  . Not on file  Social Needs  . Financial resource strain: Not on file  . Food insecurity    Worry: Not on file  Inability: Not on file  . Transportation needs    Medical: Not on file    Non-medical: Not on file  Tobacco Use  . Smoking status: Current Every Day Smoker    Packs/day: 1.00    Years: 30.00    Pack years: 30.00    Types: Cigarettes    Last attempt to quit: 05/21/2017    Years since quitting: 2.2  . Smokeless tobacco: Never Used  Substance and Sexual Activity  . Alcohol use: Yes    Alcohol/week: 3.0 standard drinks    Types: 3 Cans of beer per week  . Drug use: No  . Sexual activity: Not on file  Lifestyle  . Physical activity    Days per week: Not on file    Minutes per session: Not on file  . Stress: Not on file  Relationships   . Social Herbalist on phone: Not on file    Gets together: Not on file    Attends religious service: Not on file    Active member of club or organization: Not on file    Attends meetings of clubs or organizations: Not on file    Relationship status: Not on file  . Intimate partner violence    Fear of current or ex partner: Not on file    Emotionally abused: Not on file    Physically abused: Not on file    Forced sexual activity: Not on file  Other Topics Concern  . Not on file  Social History Narrative  . Not on file   Family History  Problem Relation Age of Onset  . Kidney disease Mother   . Heart attack Mother 8       died  . Cancer Father   . Heart attack Father 2       died  . Diabetes Sister   . Diabetes Maternal Grandmother     Objective: Office vital signs reviewed. BP (!) 146/90   Pulse 80   Temp 99.2 F (37.3 C) (Temporal)   Ht 5\' 7"  (1.702 m)   Wt 226 lb (102.5 kg)   BMI 35.40 kg/m   Physical Examination:  General: Awake, alert, well nourished, No acute distress HEENT: Normal, sclera white, MMM; exotropia on right. Cardio: regular rate and rhythm, S1S2 heard, no murmurs appreciated Pulm: clear to auscultation bilaterally, no wheezes, +rhonchi on the left that clear with coughing; normal work of breathing on room air Extremities: warm, well perfused, No edema, cyanosis or clubbing; +2 pulses bilaterally Psych: mood stable, speech normal Depression screen [facility] 2/9 08/11/2019 02/22/2019 12/29/2018 12/22/2018 11/23/2018  Decreased Interest 0 0 0 0 0  Down, Depressed, Hopeless 0 0 0 0 0  PHQ - 2 Score 0 0 0 0 0  Altered sleeping 0 0 - - 0  Tired, decreased energy 0 0 - - 0  Change in appetite 0 - - - 0  Feeling bad or failure about yourself  0 0 - - 0  Trouble concentrating 0 0 - - 0  Moving slowly or fidgety/restless 0 0 - - 0  Suicidal thoughts 0 0 - - 0  PHQ-9 Score 0 0 - - 0  Difficult doing work/chores - - - - Not difficult at all    Assessment/ Plan: 48 y.o. male   1. Diet-controlled diabetes mellitus (David Bartlett) A1c down to 5.7 today from 8.2 in November 2019.  I congratulated him on his excellent weight loss and lifestyle modification.  No medications  needed at this time.  He did decline pneumococcal and have asked him to think this over.  He should come back for influenza vaccination as well.  I gave him an AVS which indicated the gold standard of care.  Advised him to go ahead and schedule diabetic eye exam, particularly given comorbidity of hypertension.  2. Hypertension associated with diabetes ([place]) BP persistently elevated.  Patient to monitor closely and return in 4 weeks for BP check.  May need to start something for stress or increase Lisinopril to 20mg  - amLODipine (NORVASC) 10 MG tablet; Take 1 tablet (10 mg total) by mouth daily.  Dispense: 90 tablet; Refill: 3 - lisinopril (ZESTRIL) 10 MG tablet; TAKE ONE (1) TABLET EACH DAY  Dispense: 90 tablet; Refill: 3 - triamterene-hydrochlorothiazide (MAXZIDE) 75-50 MG tablet; Take 1 tablet by mouth every morning.  Dispense: 90 tablet; Refill: 3  3. Hyperlipidemia associated with type 2 diabetes mellitus (HCC) Lipid panel obtained.  Continue statin - atorvastatin (LIPITOR) 20 MG tablet; TAKE ONE (1) TABLET EACH DAY  Dispense: 90 tablet; Refill: 3  4. Tobacco abuse Counseling performed.  Patient is contemplative.   No orders of the defined types were placed in this encounter.  No orders of the defined types were placed in this encounter.  Janora Norlander, DO [place] [phone]

## 2019-08-12 ENCOUNTER — Telehealth: Payer: Self-pay | Admitting: Family Medicine

## 2019-08-12 LAB — CMP14+EGFR
ALT: 99 IU/L — ABNORMAL HIGH (ref 0–44)
AST: 107 IU/L — ABNORMAL HIGH (ref 0–40)
Albumin/Globulin Ratio: 1.7 (ref 1.2–2.2)
Albumin: 4.8 g/dL (ref 4.0–5.0)
Alkaline Phosphatase: 84 IU/L (ref 39–117)
BUN/Creatinine Ratio: 15 (ref 9–20)
BUN: 13 mg/dL (ref 6–24)
Bilirubin Total: 0.7 mg/dL (ref 0.0–1.2)
CO2: 23 mmol/L (ref 20–29)
Calcium: 9.4 mg/dL (ref 8.7–10.2)
Chloride: 95 mmol/L — ABNORMAL LOW (ref 96–106)
Creatinine, Ser: 0.86 mg/dL (ref 0.76–1.27)
GFR calc Af Amer: 118 mL/min/{1.73_m2} (ref >59)
GFR calc non Af Amer: 102 mL/min/{1.73_m2} (ref >59)
Globulin, Total: 2.8 g/dL (ref 1.5–4.5)
Glucose: 119 mg/dL — ABNORMAL HIGH (ref 65–99)
Potassium: 4.2 mmol/L (ref 3.5–5.2)
Sodium: 139 mmol/L (ref 134–144)
Total Protein: 7.6 g/dL (ref 6.0–8.5)

## 2019-08-12 LAB — LIPID PANEL
Chol/HDL Ratio: 2.7 ratio (ref 0.0–5.0)
Cholesterol, Total: 152 mg/dL (ref 100–199)
HDL: 57 mg/dL (ref >39)
LDL Calculated: 68 mg/dL (ref 0–99)
Triglycerides: 134 mg/dL (ref 0–149)
VLDL Cholesterol Cal: 27 mg/dL (ref 5–40)

## 2019-08-12 MED ORDER — ASPIRIN 81 MG PO CHEW: 81.0000 mg | CHEWABLE_TABLET | Freq: Two times a day (BID) | ORAL | 1 refills | Status: DC

## 2019-08-12 MED ORDER — ASPIRIN 81 MG PO CHEW: 81.0000 mg | CHEWABLE_TABLET | Freq: Two times a day (BID) | ORAL | 3 refills | Status: DC

## 2019-08-12 NOTE — Telephone Encounter (Signed)
Left detailed message on patients voicemail that rx was sent to pharmacy for ASA

## 2019-08-13 ENCOUNTER — Other Ambulatory Visit: Payer: Self-pay | Admitting: Family Medicine

## 2019-08-13 DIAGNOSIS — R748 Abnormal levels of other serum enzymes: Secondary | ICD-10-CM

## 2019-09-10 ENCOUNTER — Other Ambulatory Visit: Payer: Self-pay | Admitting: Family Medicine

## 2019-11-22 ENCOUNTER — Other Ambulatory Visit: Payer: Self-pay | Admitting: Family Medicine

## 2020-01-27 ENCOUNTER — Other Ambulatory Visit: Payer: Self-pay | Admitting: Family Medicine

## 2020-02-08 ENCOUNTER — Telehealth: Payer: Self-pay | Admitting: Family Medicine

## 2020-02-08 ENCOUNTER — Encounter: Payer: Self-pay | Admitting: Family Medicine

## 2020-02-08 ENCOUNTER — Other Ambulatory Visit: Payer: Self-pay

## 2020-02-08 ENCOUNTER — Ambulatory Visit: Payer: PRIVATE HEALTH INSURANCE | Admitting: Family Medicine

## 2020-02-08 VITALS — BP 131/83 | HR 98 | Temp 99.2°F | Ht 67.0 in | Wt 219.0 lb

## 2020-02-08 DIAGNOSIS — R748 Abnormal levels of other serum enzymes: Secondary | ICD-10-CM

## 2020-02-08 DIAGNOSIS — E119 Type 2 diabetes mellitus without complications: Secondary | ICD-10-CM | POA: Diagnosis not present

## 2020-02-08 DIAGNOSIS — J9801 Acute bronchospasm: Secondary | ICD-10-CM | POA: Diagnosis not present

## 2020-02-08 DIAGNOSIS — Z720 Tobacco use: Secondary | ICD-10-CM | POA: Diagnosis not present

## 2020-02-08 LAB — BAYER DCA HB A1C WAIVED: HB A1C (BAYER DCA - WAIVED): 6.1 % (ref <7.0)

## 2020-02-08 MED ORDER — PREDNISONE 20 MG PO TABS: 40.0000 mg | ORAL_TABLET | Freq: Every day | ORAL | 0 refills | Status: AC

## 2020-02-08 MED ORDER — ALBUTEROL SULFATE HFA 108 (90 BASE) MCG/ACT IN AERS: INHALATION_SPRAY | RESPIRATORY_TRACT | 3 refills | Status: AC

## 2020-02-08 NOTE — Patient Instructions (Addendum)
Sugar has crept up a little bit.  Keep an eye on diet/ sugar intake.  Get your diabetic eye exam done  I am sending you in a few days of prednisone.  You sounded pretty wheezey today  Use the albuterol every 6 hours for the next 2 days.  I'd like to recheck your lungs in about 2 weeks.  If you are still having wheezes, we might start a controller inhaler.   Bronchospasm, Adult  Bronchospasm is when airways in the lungs get smaller. When this happens, it can be hard to breathe. You may cough. You may also make a whistling sound when you breathe (wheeze). Follow these instructions at home: Medicines  Take over-the-counter and prescription medicines only as told by your doctor.  If you need to use an inhaler or nebulizer to take your medicine, ask your doctor how to use it.  If you were given a spacer, always use it with your inhaler. Lifestyle  Change your heating and air conditioning filter. Do this at least once a month.  Try not to use fireplaces and wood stoves.  Do not  smoke. Do not  allow smoking in your home.  Try not to use things that have a strong smell, like perfume.  Get rid of pests (such as roaches and mice) and their poop.  Remove any mold from your home.  Keep your house clean. Get rid of dust.  Use cleaning products that have no smell.  Replace carpet with wood, tile, or vinyl flooring.  Use allergy-proof pillows, mattress covers, and box spring covers.  Wash bed sheets and blankets every week. Use hot water. Dry them in a dryer.  Use blankets that are made of polyester or cotton.  Wash your hands often.  Keep pets out of your bedroom.  When you exercise, try not to breathe in cold air. General instructions  Have a plan for getting medical care. Know these things: ? When to call your doctor. ? When to call local emergency services (911 in the [place] ? Where to go in an emergency.  Stay up to date on your shots (immunizations).  When you  have an episode: ? Stay calm. ? Relax. ? Breathe slowly. Contact a doctor if:  Your muscles ache.  Your chest hurts.  The color of the mucus you cough up (sputum) changes from clear or white to yellow, green, gray, or bloody.  The mucus you cough up gets thicker.  You have a fever. Get help right away if:  The whistling sound gets worse, even after you take your medicines.  Your coughing gets worse.  You find it even harder to breathe.  Your chest hurts very much. Summary  Bronchospasm is when airways in the lungs get smaller.  When this happens, it can be hard to breathe. You may cough. You may also make a whistling sound when you breathe.  Stay away from things that cause you to have episodes. These include smoke or dust. This information is not intended to replace advice given to you by your health care provider. Make sure you discuss any questions you have with your health care provider. Document Revised: 11/14/2017 Document Reviewed: 12/05/2016 Elsevier Patient Education  2020 [employer].

## 2020-02-08 NOTE — Progress Notes (Addendum)
Subjective: CC: DM2, HTN, HLD PCP: Janora Norlander, DO [id number] David Bartlett is a 49 y.o. male presenting to clinic today for:  1. Type 2 Diabetes w/ HTN and hyperlipidemia; stress at work ; tobacco use disorder Patient has been compliant with Lipitor 20 mg daily, Norvasc 10 mg daily, lisinopril 10 mg daily and triamterene-hydrochlorothiazide.  He has not been as strict about his diet since our last visit.  Last eye exam: due Last foot exam: UTD Last A1c:  Lab Results  Component Value Date   HGBA1C 5.7 08/11/2019   Nephropathy screen indicated?: on ACE-I Last flu, zoster and/or pneumovax:  There is no immunization history on file for this patient.  ROS: Denies chest pain, shortness of breath, lower extremity edema, dizziness, visual disturbance, polyuria, polydipsia or sensation changes in the periphery.  2.  Tobacco use disorder Patient continues to smoke daily.  He has smoker's cough that is unchanged.  Denies any hemoptysis, change in tolerance of activities, difficulty swallowing, change in voice, night sweats or chills.  He uses his albuterol inhaler a couple of times per week.  Cough is occasionally productive with clear phlegm.  No fevers.   Allergies  Allergen Reactions  . Penicillins Other (See Comments)    Under arms swell   Past Medical History:  Diagnosis Date  . Abnormal EKG   . Asthmatic bronchitis   . Diabetes mellitus without complication ([place])   . HTN (hypertension)   . Left breast mass    resolved on its own   . Smoker     Current Outpatient Medications:  .  albuterol (VENTOLIN HFA) 108 (90 Base) MCG/ACT inhaler, USE 2 PUFFS EVERY 6 HOURS AS NEEDED, Disp: 18 g, Rfl: 0 .  amLODipine (NORVASC) 10 MG tablet, Take 1 tablet (10 mg total) by mouth daily., Disp: 90 tablet, Rfl: 3 .  aspirin 81 MG chewable tablet, Chew 1 tablet (81 mg total) by mouth 2 (two) times daily., Disp: 60 tablet, Rfl: 3 .  atorvastatin (LIPITOR) 20 MG tablet, TAKE ONE (1) TABLET  EACH DAY, Disp: 90 tablet, Rfl: 3 .  Cetirizine HCl 10 MG TBDP, Take 1 tablet by mouth daily., Disp: , Rfl:  .  cholecalciferol (VITAMIN D) 1000 units tablet, Take 1,000 Units by mouth daily., Disp: , Rfl:  .  lisinopril (ZESTRIL) 10 MG tablet, TAKE ONE (1) TABLET EACH DAY, Disp: 90 tablet, Rfl: 3 .  triamterene-hydrochlorothiazide (MAXZIDE) 75-50 MG tablet, Take 1 tablet by mouth every morning., Disp: 90 tablet, Rfl: 3 .  vitamin C (ASCORBIC ACID) 250 MG tablet, Take 250 mg by mouth daily., Disp: , Rfl:  Social History   Socioeconomic History  . Marital status: Married    Spouse name: Not on file  . Number of children: Not on file  . Years of education: Not on file  . Highest education level: Not on file  Occupational History  . Not on file  Tobacco Use  . Smoking status: Current Every Day Smoker    Packs/day: 1.00    Years: 30.00    Pack years: 30.00    Types: Cigarettes    Last attempt to quit: 05/21/2017    Years since quitting: 2.7  . Smokeless tobacco: Never Used  Substance and Sexual Activity  . Alcohol use: Yes    Alcohol/week: 3.0 standard drinks    Types: 3 Cans of beer per week  . Drug use: No  . Sexual activity: Not on file  Other Topics Concern  .  Not on file  Social History Narrative  . Not on file   Social Determinants of Health   Financial Resource Strain:   . Difficulty of Paying Living Expenses: Not on file  Food Insecurity:   . Worried About Charity fundraiser in the Last Year: Not on file  . Ran Out of Food in the Last Year: Not on file  Transportation Needs:   . Lack of Transportation (Medical): Not on file  . Lack of Transportation (Non-Medical): Not on file  Physical Activity:   . Days of Exercise per Week: Not on file  . Minutes of Exercise per Session: Not on file  Stress:   . Feeling of Stress : Not on file  Social Connections:   . Frequency of Communication with Friends and Family: Not on file  . Frequency of Social Gatherings with  Friends and Family: Not on file  . Attends Religious Services: Not on file  . Active Member of Clubs or Organizations: Not on file  . Attends Archivist Meetings: Not on file  . Marital Status: Not on file  Intimate Partner Violence:   . Fear of Current or Ex-Partner: Not on file  . Emotionally Abused: Not on file  . Physically Abused: Not on file  . Sexually Abused: Not on file   Family History  Problem Relation Age of Onset  . Kidney disease Mother   . Heart attack Mother 17       died  . Cancer Father   . Heart attack Father 61       died  . Diabetes Sister   . Diabetes Maternal Grandmother     Objective: Office vital signs reviewed. BP 131/83   Pulse 98   Temp 99.2 F (37.3 C) (Temporal)   Ht '5\' 7"'  (1.702 m)   Wt 219 lb (99.3 kg)   SpO2 94%   BMI 34.30 kg/m   Physical Examination:  General: Awake, alert, well nourished, No acute distress HEENT: Normal, sclera white, MMM; exotropia on right. Cardio: regular rate and rhythm, S1S2 heard, no murmurs appreciated Pulm: global expiratory and inspiratory wheezes.  Normal work of breathing on room air. Extremities: warm, well perfused, No edema, cyanosis or clubbing; +2 pulses bilaterally Psych: mood stable, speech normal Depression screen [facility] 2/9 02/08/2020 08/11/2019 02/22/2019 12/29/2018 12/22/2018  Decreased Interest 0 0 0 0 0  Down, Depressed, Hopeless 0 0 0 0 0  PHQ - 2 Score 0 0 0 0 0  Altered sleeping 0 0 0 - -  Tired, decreased energy 0 0 0 - -  Change in appetite 0 0 - - -  Feeling bad or failure about yourself  0 0 0 - -  Trouble concentrating 0 0 0 - -  Moving slowly or fidgety/restless 0 0 0 - -  Suicidal thoughts 0 0 0 - -  PHQ-9 Score 0 0 0 - -  Difficult doing work/chores - - - - -   Assessment/ Plan: 49 y.o. male   1. Diet-controlled diabetes mellitus (HCC) A1c up slightly but still within normal range.  6.1 today.  Advised to keep modifying diet.  He is due for eye exam as well - Bayer DCA  Hb A1c Waived  2. Elevated liver enzymes Noted to be elevated on last CMP.  We will check for other etiology of elevation - HIV antibody (with reflex) - Hepatitis C antibody - Hepatitis panel, acute - CMP14+EGFR  3. Bronchospasm Wheezes noted on today's exam.  I do question undiagnosed COPD given ongoing smoking.  I am putting him on a prednisone burst and I have advised him to use albuterol inhaler scheduled for the next 2 days then as needed.  At this point do not appreciate any infectious symptoms or red flag signs or symptoms.  I would like to see him back in about 3 weeks for lung recheck.  We discussed the possibility of need for controller inhaler. - predniSONE (DELTASONE) 20 MG tablet; Take 2 tablets (40 mg total) by mouth daily with breakfast for 5 days.  Dispense: 10 tablet; Refill: 0 - albuterol (VENTOLIN HFA) 108 (90 Base) MCG/ACT inhaler; USE 2 PUFFS EVERY 6 HOURS AS NEEDED  Dispense: 18 g; Refill: 3  4. Tobacco abuse Counseling.  Cessation encouraged.   Orders Placed This Encounter  Procedures  . Bayer DCA Hb A1c Waived   No orders of the defined types were placed in this encounter.  Janora Norlander, DO [place] [phone]

## 2020-02-08 NOTE — Telephone Encounter (Signed)
Two week follow up appointment scheduled for 02/23/20 at 3:00 pm with Dr. Lajuana Ripple.

## 2020-02-09 LAB — CMP14+EGFR
ALT: 91 IU/L — ABNORMAL HIGH (ref 0–44)
AST: 80 IU/L — ABNORMAL HIGH (ref 0–40)
Albumin/Globulin Ratio: 1.8 (ref 1.2–2.2)
Albumin: 4.4 g/dL (ref 4.0–5.0)
Alkaline Phosphatase: 132 IU/L — ABNORMAL HIGH (ref 39–117)
BUN/Creatinine Ratio: 8 — ABNORMAL LOW (ref 9–20)
BUN: 6 mg/dL (ref 6–24)
Bilirubin Total: 0.3 mg/dL (ref 0.0–1.2)
CO2: 23 mmol/L (ref 20–29)
Calcium: 9.3 mg/dL (ref 8.7–10.2)
Chloride: 98 mmol/L (ref 96–106)
Creatinine, Ser: 0.74 mg/dL — ABNORMAL LOW (ref 0.76–1.27)
GFR calc Af Amer: 125 mL/min/{1.73_m2} (ref >59)
GFR calc non Af Amer: 108 mL/min/{1.73_m2} (ref >59)
Globulin, Total: 2.5 g/dL (ref 1.5–4.5)
Glucose: 124 mg/dL — ABNORMAL HIGH (ref 65–99)
Potassium: 4.1 mmol/L (ref 3.5–5.2)
Sodium: 141 mmol/L (ref 134–144)
Total Protein: 6.9 g/dL (ref 6.0–8.5)

## 2020-02-09 LAB — HEPATITIS PANEL, ACUTE
Hep A IgM: NEGATIVE
Hep B C IgM: NEGATIVE
Hep C Virus Ab: <0.1 s/co ratio (ref 0.0–0.9)
Hepatitis B Surface Ag: NEGATIVE

## 2020-02-09 LAB — HIV ANTIBODY (ROUTINE TESTING W REFLEX): HIV Screen 4th Generation wRfx: NONREACTIVE

## 2020-02-11 ENCOUNTER — Encounter: Payer: Self-pay | Admitting: Physician Assistant

## 2020-02-11 ENCOUNTER — Other Ambulatory Visit: Payer: Self-pay | Admitting: Family Medicine

## 2020-02-11 DIAGNOSIS — R748 Abnormal levels of other serum enzymes: Secondary | ICD-10-CM

## 2020-02-11 NOTE — Progress Notes (Signed)
Aware of all lab results .  He prefers a referral to [facility].

## 2020-02-18 ENCOUNTER — Other Ambulatory Visit (INDEPENDENT_AMBULATORY_CARE_PROVIDER_SITE_OTHER): Payer: 59

## 2020-02-18 ENCOUNTER — Encounter: Payer: Self-pay | Admitting: Physician Assistant

## 2020-02-18 ENCOUNTER — Ambulatory Visit (INDEPENDENT_AMBULATORY_CARE_PROVIDER_SITE_OTHER): Payer: 59 | Admitting: Physician Assistant

## 2020-02-18 VITALS — BP 134/82 | HR 104 | Temp 98.6°F | Ht 67.0 in | Wt 211.2 lb

## 2020-02-18 DIAGNOSIS — R7989 Other specified abnormal findings of blood chemistry: Secondary | ICD-10-CM

## 2020-02-18 LAB — CBC WITH DIFFERENTIAL/PLATELET
Basophils Absolute: 0.1 10*3/uL (ref 0.0–0.1)
Basophils Relative: 1 % (ref 0.0–3.0)
Eosinophils Absolute: 0 10*3/uL (ref 0.0–0.7)
Eosinophils Relative: 0.5 % (ref 0.0–5.0)
HCT: 45.3 % (ref 39.0–52.0)
Hemoglobin: 15.9 g/dL (ref 13.0–17.0)
Lymphocytes Relative: 21.8 % (ref 12.0–46.0)
Lymphs Abs: 1.7 10*3/uL (ref 0.7–4.0)
MCHC: 35.2 g/dL (ref 30.0–36.0)
MCV: 96 fl (ref 78.0–100.0)
Monocytes Absolute: 1 10*3/uL (ref 0.1–1.0)
Monocytes Relative: 12.9 % — ABNORMAL HIGH (ref 3.0–12.0)
Neutro Abs: 4.9 10*3/uL (ref 1.4–7.7)
Neutrophils Relative %: 63.8 % (ref 43.0–77.0)
Platelets: 159 10*3/uL (ref 150.0–400.0)
RBC: 4.71 Mil/uL (ref 4.22–5.81)
RDW: 12.9 % (ref 11.5–15.5)
WBC: 7.6 10*3/uL (ref 4.0–10.5)

## 2020-02-18 LAB — COMPREHENSIVE METABOLIC PANEL
ALT: 83 U/L — ABNORMAL HIGH (ref 0–53)
AST: 84 U/L — ABNORMAL HIGH (ref 0–37)
Albumin: 4.4 g/dL (ref 3.5–5.2)
Alkaline Phosphatase: 91 U/L (ref 39–117)
BUN: 8 mg/dL (ref 6–23)
CO2: 24 mEq/L (ref 19–32)
Calcium: 9.1 mg/dL (ref 8.4–10.5)
Chloride: 95 mEq/L — ABNORMAL LOW (ref 96–112)
Creatinine, Ser: 0.7 mg/dL (ref 0.40–1.50)
GFR: 119.56 mL/min (ref >60.00)
Glucose, Bld: 121 mg/dL — ABNORMAL HIGH (ref 70–99)
Potassium: 4 mEq/L (ref 3.5–5.1)
Sodium: 132 mEq/L — ABNORMAL LOW (ref 135–145)
Total Bilirubin: 0.5 mg/dL (ref 0.2–1.2)
Total Protein: 7.8 g/dL (ref 6.0–8.3)

## 2020-02-18 LAB — IBC + FERRITIN
Ferritin: 2181.5 ng/mL — ABNORMAL HIGH (ref 22.0–322.0)
Iron: 70 ug/dL (ref 42–165)
Saturation Ratios: 18.3 % — ABNORMAL LOW (ref 20.0–50.0)
Transferrin: 273 mg/dL (ref 212.0–360.0)

## 2020-02-18 LAB — PROTIME-INR
INR: 1.1 ratio — ABNORMAL HIGH (ref 0.8–1.0)
Prothrombin Time: 12.5 s (ref 9.6–13.1)

## 2020-02-18 NOTE — Patient Instructions (Signed)
If you are age 50 or older, your body mass index should be between 23-30. Your Body mass index is 33.09 kg/m. If this is out of the aforementioned range listed, please consider follow up with your Primary Care Provider.  If you are age 65 or younger, your body mass index should be between 19-25. Your Body mass index is 33.09 kg/m. If this is out of the aformentioned range listed, please consider follow up with your Primary Care Provider.   Your provider has requested that you go to the basement level for lab work before leaving today. Press "B" on the elevator. The lab is located at the first door on the left as you exit the elevator.  You have been scheduled for an abdominal ultrasound at [facility] (1st floor of hospital) on Thursday 02/24/20 at 9 am. Please arrive 15 minutes prior to your appointment for registration. Make certain not to have anything to eat or drink 6 hours prior to your appointment. Should you need to reschedule your appointment, please contact radiology at [phone]. This test typically takes about 30 minutes to perform.

## 2020-02-18 NOTE — Progress Notes (Addendum)
Chief Complaint: Elevated liver enzymes  HPI:    David Bartlett is a 49 year old Caucasian male with a past medical history as listed below, who was referred to me by Janora Norlander, DO for a complaint of elevated liver enzymes.      02/08/2020 CMP with alk phos 132 (84 08/11/2019), AST 80 (107 08/11/19), ALT 91 (99 08/11/2019).  Acute hepatitis panel negative.  HIV antibody negative.  Hepatitis C antibody negative.    Today, the patient presents clinic and explains that he has been told that his liver enzymes are elevated.  Tells me that when he was in the [employer] he did drink quite a bit, also describes some extra stress now from losing his job due to the pandemic and being furloughed.  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  Reports a 30 to 40 pound weight loss since last year.  Denies any history of known liver disease, IV drug use or family history of liver disease.    Denies abdominal pain, weight loss or change in bowel habits.  Past Medical History:  Diagnosis Date  . Abnormal EKG   . Asthmatic bronchitis   . Diabetes mellitus without complication ([place])   . HTN (hypertension)   . Left breast mass    resolved on its own   . Smoker     Past Surgical History:  Procedure Laterality Date  . JOINT REPLACEMENT    . NO PAST SURGERIES    . 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 albuterol (VENTOLIN HFA) 108 (90 Base) MCG/ACT inhaler USE 2 PUFFS EVERY 6 HOURS AS NEEDED 18 g 3  . amLODipine (NORVASC) 10 MG tablet Take 1 tablet (10 mg total) by mouth daily. 90 tablet 3  . aspirin 81 MG chewable tablet Chew 1 tablet (81 mg total) by mouth 2 (two) times daily. 60 tablet 3  . atorvastatin (LIPITOR) 20 MG tablet  TAKE ONE (1) TABLET EACH DAY 90 tablet 3  . Cetirizine HCl 10 MG TBDP Take 1 tablet by mouth daily.    . cholecalciferol (VITAMIN D) 1000 units tablet Take 1,000 Units by mouth daily.    Marland Kitchen lisinopril (ZESTRIL) 10 MG tablet TAKE ONE (1) TABLET EACH DAY 90 tablet 3  . triamterene-hydrochlorothiazide (MAXZIDE) 75-50 MG tablet Take 1 tablet by mouth every morning. 90 tablet 3  . vitamin C (ASCORBIC ACID) 250 MG tablet Take 250 mg by mouth daily.     No current facility-administered medications for this visit.    Allergies as of 02/18/2020 - Review Complete 02/18/2020  Allergen Reaction Noted  . Penicillins Other (See Comments) 05/16/2011    Family History  Problem Relation Age of Onset  . Kidney disease Mother   . Heart attack Mother 91       died  . Cancer Father   . Heart attack Father 81       died  . Diabetes Sister   . Diabetes Maternal Grandmother   . Colon cancer Neg Hx   . Esophageal cancer Neg Hx   . Stomach cancer Neg Hx   . Pancreatic cancer Neg Hx     Social History   Socioeconomic History  . Marital status: Married  Spouse name: Not on file  . Number of children: Not on file  . Years of education: Not on file  . Highest education level: Not on file  Occupational History  . Not on file  Tobacco Use  . Smoking status: Current Every Day Smoker    Packs/day: 1.00    Years: 30.00    Pack years: 30.00    Types: Cigarettes  . Smokeless tobacco: Never Used  Substance and Sexual Activity  . Alcohol use: Yes    Alcohol/week: 3.0 standard drinks    Types: 3 Cans of beer per week    Comment: daily  . Drug use: No  . Sexual activity: Not on file  Other Topics Concern  . Not on file  Social History Narrative  . Not on file   Social Determinants of Health   Financial Resource Strain:   . Difficulty of Paying Living Expenses: Not on file  Food Insecurity:   . Worried About Charity fundraiser in the Last Year: Not on file  . Ran Out of Food in the Last  Year: Not on file  Transportation Needs:   . Lack of Transportation (Medical): Not on file  . Lack of Transportation (Non-Medical): Not on file  Physical Activity:   . Days of Exercise per Week: Not on file  . Minutes of Exercise per Session: Not on file  Stress:   . Feeling of Stress : Not on file  Social Connections:   . Frequency of Communication with Friends and Family: Not on file  . Frequency of Social Gatherings with Friends and Family: Not on file  . Attends Religious Services: Not on file  . Active Member of Clubs or Organizations: Not on file  . Attends Archivist Meetings: Not on file  . Marital Status: Not on file  Intimate Partner Violence:   . Fear of Current or Ex-Partner: Not on file  . Emotionally Abused: Not on file  . Physically Abused: Not on file  . Sexually Abused: Not on file    Review of Systems:    Constitutional: No weight loss, fever or chills Skin: No rash  Cardiovascular: No chest pain Respiratory: No SOB  Gastrointestinal: See HPI and otherwise negative Genitourinary: No dysuria Neurological: No headache Musculoskeletal: No new muscle or joint pain Hematologic: No bleeding  Psychiatric: No history of depression or anxiety   Physical Exam:  Vital signs: BP 134/82 (BP Location: Left Arm, Patient Position: Sitting, Cuff Size: Large)   Pulse (!) 104   Temp 98.6 F (37 C)   Ht '5\' 7"'  (1.702 m)   Wt 211 lb 4 oz (95.8 kg)   SpO2 96%   BMI 33.09 kg/m   Constitutional:   Overweight Caucasian male appears to be in NAD, Well developed, Well nourished, alert and cooperative Head:  Normocephalic and atraumatic. Eyes:   PEERL, EOMI. No icterus. Conjunctiva pink. Ears:  Normal auditory acuity. Neck:  Supple Throat: Oral cavity and pharynx without inflammation, swelling or lesion.  Respiratory: Respirations even and unlabored. Lungs clear to auscultation bilaterally.   No wheezes, crackles, or rhonchi.  Cardiovascular: Normal S1, S2. No  MRG. Regular rate and rhythm. No peripheral edema, cyanosis or pallor.  Gastrointestinal:  Soft, nondistended, Mild RUQ ttp No rebound or guarding. Normal bowel sounds. +hepatomegaly, firm liver Rectal:  Not performed.  Msk:  Symmetrical without gross deformities. Without edema, no deformity or joint abnormality.  Neurologic:  Alert and  oriented x4;  grossly normal  neurologically.  Skin:   Dry and intact without significant lesions or rashes. Psychiatric: Demonstrates good judgement and reason without abnormal affect or behaviors.  MOST RECENT LABS AND IMAGING: CBC    Component Value Date/Time   WBC 10.2 07/31/2018 1551   WBC 14.2 (H) 08/08/2017 0613   RBC 4.83 07/31/2018 1551   RBC 3.82 (L) 08/08/2017 0613   HGB 14.6 07/31/2018 1551   HCT 42.5 07/31/2018 1551   PLT 242 07/31/2018 1551   MCV 88 07/31/2018 1551   MCH 30.2 07/31/2018 1551   MCH 30.6 08/08/2017 0613   MCHC 34.4 07/31/2018 1551   MCHC 34.6 08/08/2017 0613   RDW 13.4 07/31/2018 1551   LYMPHSABS 4.3 (H) 07/31/2018 1551   MONOABS 0.9 05/09/2010 2216   EOSABS 0.2 07/31/2018 1551   BASOSABS 0.0 07/31/2018 1551    CMP     Component Value Date/Time   NA 141 02/08/2020 1013   K 4.1 02/08/2020 1013   CL 98 02/08/2020 1013   CO2 23 02/08/2020 1013   GLUCOSE 124 (H) 02/08/2020 1013   GLUCOSE 311 (H) 08/08/2017 0613   BUN 6 02/08/2020 1013   CREATININE 0.74 (L) 02/08/2020 1013   CALCIUM 9.3 02/08/2020 1013   PROT 6.9 02/08/2020 1013   ALBUMIN 4.4 02/08/2020 1013   AST 80 (H) 02/08/2020 1013   ALT 91 (H) 02/08/2020 1013   ALKPHOS 132 (H) 02/08/2020 1013   BILITOT 0.3 02/08/2020 1013   GFRNONAA 108 02/08/2020 1013   GFRAA 125 02/08/2020 1013    Assessment: 1.  Elevated LFTs: Increasing over the past 6 months, no known history of liver disease or family history of liver disease, history of alcohol use; consider fatty liver versus statin versus cirrhosis  Plan: 1.  Ordered right upper quadrant ultrasound 2.   Ordered labs to include a CBC, CMP, PT/INR, iron studies, ANA, ASMA, AMA, alpha-1 antitrypsin, ceruloplasmin and hepatitis serologies 3.  Patient was advised to try to decrease hepatotoxic substances.  These were discussed in detail. 4. Discussed that the patient is due for colonoscopy for colon cancer screening. He declines for now. We can re-discuss at follow up. 5.  Patient to follow in clinic with me per recommendations after labs and imaging above.  Assigned to Dr. Rush Landmark this morning.  Ellouise Newer, PA-C [facility] 02/18/2020, 9:26 AM  Cc: Janora Norlander, DO

## 2020-02-20 NOTE — Progress Notes (Signed)
Attending Physician's Attestation   I have reviewed the chart.   I agree with the Advanced Practitioner's note, impression, and recommendations with any updates as below.  Diagnostic work-up for abnormal liver biochemical testing is very reasonable.  If patient's liver tests continue to be elevated after 6 to 12 months of evaluation without a diagnostic answer, will need to consider a liver biopsy.  Patient will need colon cancer screening in 1 way/shape or form and we can rediscuss at follow-up.  Justice Britain, MD [facility] Advanced Endoscopy Office # [phone]

## 2020-02-21 ENCOUNTER — Other Ambulatory Visit: Payer: Self-pay

## 2020-02-21 DIAGNOSIS — R7989 Other specified abnormal findings of blood chemistry: Secondary | ICD-10-CM

## 2020-02-22 ENCOUNTER — Other Ambulatory Visit: Payer: Self-pay

## 2020-02-23 ENCOUNTER — Ambulatory Visit: Payer: 59 | Admitting: Family Medicine

## 2020-02-23 ENCOUNTER — Other Ambulatory Visit: Payer: Self-pay

## 2020-02-23 ENCOUNTER — Encounter: Payer: Self-pay | Admitting: Family Medicine

## 2020-02-23 ENCOUNTER — Ambulatory Visit (INDEPENDENT_AMBULATORY_CARE_PROVIDER_SITE_OTHER): Payer: 59

## 2020-02-23 VITALS — BP 127/82 | HR 93 | Temp 99.3°F | Ht 67.0 in | Wt 220.0 lb

## 2020-02-23 DIAGNOSIS — Z720 Tobacco use: Secondary | ICD-10-CM

## 2020-02-23 DIAGNOSIS — R062 Wheezing: Secondary | ICD-10-CM

## 2020-02-23 LAB — MITOCHONDRIAL ANTIBODIES: Mitochondrial M2 Ab, IgG: <=20 U

## 2020-02-23 LAB — ANA: Anti Nuclear Antibody (ANA): NEGATIVE

## 2020-02-23 LAB — ANTI-SMOOTH MUSCLE ANTIBODY, IGG: Actin (Smooth Muscle) Antibody (IGG): <20 U (ref <20)

## 2020-02-23 LAB — HEPATITIS B SURFACE ANTIGEN: Hepatitis B Surface Ag: NONREACTIVE

## 2020-02-23 LAB — CERULOPLASMIN: Ceruloplasmin: 29 mg/dL (ref 18–36)

## 2020-02-23 LAB — HEPATITIS B SURFACE ANTIBODY,QUALITATIVE: Hep B S Ab: NONREACTIVE

## 2020-02-23 LAB — HEPATITIS A ANTIBODY, TOTAL: Hepatitis A AB,Total: NONREACTIVE

## 2020-02-23 LAB — ALPHA-1-ANTITRYPSIN: A-1 Antitrypsin, Ser: 213 mg/dL — ABNORMAL HIGH (ref 83–199)

## 2020-02-23 MED ORDER — ANORO ELLIPTA 62.5-25 MCG/INH IN AEPB: 1.0000 | INHALATION_SPRAY | Freq: Every day | RESPIRATORY_TRACT | 2 refills | Status: DC

## 2020-02-23 NOTE — Patient Instructions (Signed)
This is the website to go to to fill out the form for a zero dollar copay card: [email or web address]

## 2020-02-23 NOTE — Progress Notes (Signed)
Subjective: CC: Bronchospasm PCP: Janora Norlander, DO [id number] David Bartlett is a 49 y.o. male presenting to clinic today for:  1.  Bronchospasm Patient here for a 2-week follow-up.  He was noted to have pulmonary wheezes on last exam and was empirically treated with albuterol and a prednisone burst, given suspicion for possible COPD in the setting of ongoing tobacco use.  He notes he still has some chest congestion but overall breathing has remained stable.  He uses albuterol inhaler about 1 hour prior to coming into the office.  No hemoptysis, unplanned weight loss, night sweats    ROS: Per HPI  Allergies  Allergen Reactions  . Penicillins Other (See Comments)    Under arms swell   Past Medical History:  Diagnosis Date  . Abnormal EKG   . Asthmatic bronchitis   . Diabetes mellitus without complication ([place])   . HTN (hypertension)   . Left breast mass    resolved on its own   . Smoker     Current Outpatient Medications:  .  albuterol (VENTOLIN HFA) 108 (90 Base) MCG/ACT inhaler, USE 2 PUFFS EVERY 6 HOURS AS NEEDED, Disp: 18 g, Rfl: 3 .  amLODipine (NORVASC) 10 MG tablet, Take 1 tablet (10 mg total) by mouth daily., Disp: 90 tablet, Rfl: 3 .  aspirin 81 MG chewable tablet, Chew 1 tablet (81 mg total) by mouth 2 (two) times daily., Disp: 60 tablet, Rfl: 3 .  atorvastatin (LIPITOR) 20 MG tablet, TAKE ONE (1) TABLET EACH DAY, Disp: 90 tablet, Rfl: 3 .  Cetirizine HCl 10 MG TBDP, Take 1 tablet by mouth daily., Disp: , Rfl:  .  cholecalciferol (VITAMIN D) 1000 units tablet, Take 1,000 Units by mouth daily., Disp: , Rfl:  .  lisinopril (ZESTRIL) 10 MG tablet, TAKE ONE (1) TABLET EACH DAY, Disp: 90 tablet, Rfl: 3 .  triamterene-hydrochlorothiazide (MAXZIDE) 75-50 MG tablet, Take 1 tablet by mouth every morning., Disp: 90 tablet, Rfl: 3 .  vitamin C (ASCORBIC ACID) 250 MG tablet, Take 250 mg by mouth daily., Disp: , Rfl:  Social History   Socioeconomic History  . Marital  status: Married    Spouse name: Not on file  . Number of children: Not on file  . Years of education: Not on file  . Highest education level: Not on file  Occupational History  . Not on file  Tobacco Use  . Smoking status: Current Every Day Smoker    Packs/day: 1.00    Years: 30.00    Pack years: 30.00    Types: Cigarettes  . Smokeless tobacco: Never Used  Substance and Sexual Activity  . Alcohol use: Yes    Alcohol/week: 3.0 standard drinks    Types: 3 Cans of beer per week    Comment: daily  . Drug use: No  . Sexual activity: Not on file  Other Topics Concern  . Not on file  Social History Narrative  . Not on file   Social Determinants of Health   Financial Resource Strain:   . Difficulty of Paying Living Expenses: Not on file  Food Insecurity:   . Worried About Charity fundraiser in the Last Year: Not on file  . Ran Out of Food in the Last Year: Not on file  Transportation Needs:   . Lack of Transportation (Medical): Not on file  . Lack of Transportation (Non-Medical): Not on file  Physical Activity:   . Days of Exercise per Week: Not on file  .  Minutes of Exercise per Session: Not on file  Stress:   . Feeling of Stress : Not on file  Social Connections:   . Frequency of Communication with Friends and Family: Not on file  . Frequency of Social Gatherings with Friends and Family: Not on file  . Attends Religious Services: Not on file  . Active Member of Clubs or Organizations: Not on file  . Attends Archivist Meetings: Not on file  . Marital Status: Not on file  Intimate Partner Violence:   . Fear of Current or Ex-Partner: Not on file  . Emotionally Abused: Not on file  . Physically Abused: Not on file  . Sexually Abused: Not on file   Family History  Problem Relation Age of Onset  . Kidney disease Mother   . Heart attack Mother 50       died  . Cancer Father   . Heart attack Father 53       died  . Diabetes Sister   . Diabetes Maternal  Grandmother   . Colon cancer Neg Hx   . Esophageal cancer Neg Hx   . Stomach cancer Neg Hx   . Pancreatic cancer Neg Hx     Objective: Office vital signs reviewed. BP 127/82   Pulse 93   Temp 99.3 F (37.4 C) (Temporal)   Ht 5\' 7"  (1.702 m)   Wt 220 lb (99.8 kg)   SpO2 93%   BMI 34.46 kg/m   Physical Examination:  General: Awake, alert, well nourished, No acute distress Cardio: regular rate and rhythm, S1S2 heard, no murmurs appreciated Pulm: Ongoing global expiratory and inspiratory wheezing that does not clear with coughing; no rhonchi, rales.  Normal work of breathing on room air  Assessment/ Plan: 49 y.o. male   1. Wheezing High suspicion for COPD that is uncontrolled given ongoing symptoms.  I am going to trial him on Anoro.  We did discuss consideration for evaluation by pulmonology for formal pulmonary function testing but he would like to hold off on this for now as he is going to be seeing a gastroenterologist soon for elevated LFTs.  We will obtain a baseline chest x-ray given ongoing smoking status.  He will follow-up with me in about 2 to 3 months for routine checkup and we will see how he is doing on the Anoro.  Okay to continue using albuterol as needed. - umeclidinium-vilanterol (ANORO ELLIPTA) 62.5-25 MCG/INH AEPB; Inhale 1 puff into the lungs daily.  Dispense: 60 each; Refill: 2 - DG Chest 2 View; Future  2. Tobacco abuse Cessation encouraged - DG Chest 2 View; Future   No orders of the defined types were placed in this encounter.  No orders of the defined types were placed in this encounter.    Janora Norlander, DO [place] [phone]

## 2020-02-24 ENCOUNTER — Ambulatory Visit (HOSPITAL_COMMUNITY)
Admission: RE | Admit: 2020-02-24 | Discharge: 2020-02-24 | Disposition: A | Discharge status: Home / Self Care | Payer: 59 | Source: Ambulatory Visit | Attending: Physician Assistant | Admitting: Physician Assistant

## 2020-02-24 ENCOUNTER — Other Ambulatory Visit: Payer: Self-pay

## 2020-02-24 DIAGNOSIS — R7989 Other specified abnormal findings of blood chemistry: Secondary | ICD-10-CM | POA: Diagnosis not present

## 2020-02-25 ENCOUNTER — Telehealth: Payer: Self-pay

## 2020-02-25 NOTE — Telephone Encounter (Signed)
See result note.  

## 2020-02-25 NOTE — Telephone Encounter (Signed)
Pt returned your call.  

## 2020-02-25 NOTE — Telephone Encounter (Signed)
Left message to please call back. °

## 2020-04-17 ENCOUNTER — Other Ambulatory Visit: Payer: Self-pay | Admitting: Family Medicine

## 2020-04-17 DIAGNOSIS — R062 Wheezing: Secondary | ICD-10-CM

## 2020-05-23 ENCOUNTER — Ambulatory Visit: Payer: 59 | Admitting: Family Medicine

## 2020-06-01 ENCOUNTER — Other Ambulatory Visit: Payer: Self-pay

## 2020-06-01 ENCOUNTER — Ambulatory Visit: Payer: 59 | Admitting: Family Medicine

## 2020-06-01 ENCOUNTER — Encounter: Payer: Self-pay | Admitting: Family Medicine

## 2020-06-01 VITALS — BP 137/75 | HR 103 | Temp 97.9°F | Ht 67.0 in | Wt 217.0 lb

## 2020-06-01 DIAGNOSIS — I152 Hypertension secondary to endocrine disorders: Secondary | ICD-10-CM

## 2020-06-01 DIAGNOSIS — E1159 Type 2 diabetes mellitus with other circulatory complications: Secondary | ICD-10-CM

## 2020-06-01 DIAGNOSIS — E785 Hyperlipidemia, unspecified: Secondary | ICD-10-CM

## 2020-06-01 DIAGNOSIS — I10 Essential (primary) hypertension: Secondary | ICD-10-CM

## 2020-06-01 DIAGNOSIS — E1169 Type 2 diabetes mellitus with other specified complication: Secondary | ICD-10-CM | POA: Diagnosis not present

## 2020-06-01 LAB — BAYER DCA HB A1C WAIVED: HB A1C (BAYER DCA - WAIVED): 5.9 % (ref <7.0)

## 2020-06-01 NOTE — Progress Notes (Signed)
Subjective: CC: DM2, HTN, HLD PCP: Janora Norlander, DO [id number] David Bartlett is a 49 y.o. male presenting to clinic today for:  1. Diet controlled type 2 Diabetes w/ HTN and hyperlipidemia; tobacco use disorder Patient has been compliant with Lipitor 20 mg daily, Norvasc 10 mg daily, lisinopril 10 mg daily and triamterene-hydrochlorothiazide.  He has lost about 8 pounds since her last visit.   No chest pain, shortness of breath, visual disturbance.  He has not been utilizing his inhalers because he does not like the powder he feel of the Anoro.  Last eye exam: needs Last foot exam: needs Last A1c:  Lab Results  Component Value Date   HGBA1C 6.1 02/08/2020   Nephropathy screen indicated?: on ACE-I Last flu, zoster and/or pneumovax: declines vaccines.   Allergies  Allergen Reactions   Penicillins Other (See Comments)    Under arms swell   Past Medical History:  Diagnosis Date   Abnormal EKG    Asthmatic bronchitis    Diabetes mellitus without complication (HCC)    HTN (hypertension)    Left breast mass    resolved on its own    Smoker     Current Outpatient Medications:    albuterol (VENTOLIN HFA) 108 (90 Base) MCG/ACT inhaler, USE 2 PUFFS EVERY 6 HOURS AS NEEDED, Disp: 18 g, Rfl: 3   amLODipine (NORVASC) 10 MG tablet, Take 1 tablet (10 mg total) by mouth daily., Disp: 90 tablet, Rfl: 3   ANORO ELLIPTA 62.5-25 MCG/INH AEPB, USE 1 INHALATION DAILY, Disp: 60 each, Rfl: 0   aspirin 81 MG chewable tablet, Chew 1 tablet (81 mg total) by mouth 2 (two) times daily., Disp: 60 tablet, Rfl: 3   atorvastatin (LIPITOR) 20 MG tablet, TAKE ONE (1) TABLET EACH DAY, Disp: 90 tablet, Rfl: 3   Cetirizine HCl 10 MG TBDP, Take 1 tablet by mouth daily., Disp: , Rfl:    cholecalciferol (VITAMIN D) 1000 units tablet, Take 1,000 Units by mouth daily., Disp: , Rfl:    lisinopril (ZESTRIL) 10 MG tablet, TAKE ONE (1) TABLET EACH DAY, Disp: 90 tablet, Rfl: 3    triamterene-hydrochlorothiazide (MAXZIDE) 75-50 MG tablet, Take 1 tablet by mouth every morning., Disp: 90 tablet, Rfl: 3   vitamin C (ASCORBIC ACID) 250 MG tablet, Take 250 mg by mouth daily., Disp: , Rfl:  Social History   Socioeconomic History   Marital status: Married    Spouse name: Not on file   Number of children: Not on file   Years of education: Not on file   Highest education level: Not on file  Occupational History   Not on file  Tobacco Use   Smoking status: Current Every Day Smoker    Packs/day: 1.00    Years: 30.00    Pack years: 30.00    Types: Cigarettes   Smokeless tobacco: Never Used  Vaping Use   Vaping Use: Former  Substance and Sexual Activity   Alcohol use: Yes    Alcohol/week: 3.0 standard drinks    Types: 3 Cans of beer per week    Comment: daily   Drug use: No   Sexual activity: Not on file  Other Topics Concern   Not on file  Social History Narrative   Not on file   Social Determinants of Health   Financial Resource Strain:    Difficulty of Paying Living Expenses:   Food Insecurity:    Worried About [place] in the Last Year:  Ran Out of Food in the Last Year:   Transportation Needs:    Film/video editor (Medical):    Lack of Transportation (Non-Medical):   Physical Activity:    Days of Exercise per Week:    Minutes of Exercise per Session:   Stress:    Feeling of Stress :   Social Connections:    Frequency of Communication with Friends and Family:    Frequency of Social Gatherings with Friends and Family:    Attends Religious Services:    Active Member of Clubs or Organizations:    Attends Music therapist:    Marital Status:   Intimate Partner Violence:    Fear of Current or Ex-Partner:    Emotionally Abused:    Physically Abused:    Sexually Abused:    Family History  Problem Relation Age of Onset   Kidney disease Mother    Heart attack Mother 16       died     Cancer Father    Heart attack Father 50       died   Diabetes Sister    Diabetes Maternal Grandmother    Colon cancer Neg Hx    Esophageal cancer Neg Hx    Stomach cancer Neg Hx    Pancreatic cancer Neg Hx     Objective: Office vital signs reviewed. BP 137/75    Pulse (!) 103    Temp 97.9 F (36.6 C)    Ht 5\' 7"  (1.702 m)    Wt 217 lb (98.4 kg)    SpO2 95%    BMI 33.99 kg/m   Physical Examination:  General: Awake, alert, well nourished, No acute distress HEENT: Normal, sclera white, MMM; exotropia on right. Cardio: regular rate and rhythm, S1S2 heard, no murmurs appreciated Pulm: global expiratory and inspiratory wheezes.  Normal work of breathing on room air. Extremities: warm, well perfused, No edema, cyanosis or clubbing; +2 pulses bilaterally  Diabetic Foot Exam - Simple   Simple Foot Form Diabetic Foot exam was performed with the following findings: Yes 06/01/2020 10:45 AM  Visual Inspection No deformities, no ulcerations, no other skin breakdown bilaterally: Yes Sensation Testing Intact to touch and monofilament testing bilaterally: Yes Pulse Check Posterior Tibialis and Dorsalis pulse intact bilaterally: Yes Comments Onychomycotic changes to the nails bilaterally.  No ulcers or calluses noted      Assessment/ Plan: 49 y.o. male   1. Type 2 diabetes mellitus with other specified complication, without long-term current use of insulin (HCC) Continues to be under excellent control with diet alone.  No changes to therapies.  He brings a form today which asked for urine microalbumin and therefore this was obtained.  However, we discussed that the fact that he is on an ACE inhibitor is renally protective.  Form was completed and given back to the patient - Bayer DCA Hb A1c Waived - Microalbumin / creatinine urine ratio  2. Hypertension associated with diabetes ([place]) Controlled  3. Hyperlipidemia associated with type 2 diabetes mellitus ([place]) Continue  statin  Follow-up in 6 months unless you need me sooner   No orders of the defined types were placed in this encounter.  No orders of the defined types were placed in this encounter.  Janora Norlander, DO [place] [phone]

## 2020-07-28 ENCOUNTER — Telehealth: Payer: Self-pay | Admitting: Family Medicine

## 2020-07-28 NOTE — Telephone Encounter (Signed)
Pt needing a HFU in the next couple of weeks. Being d/c from [place] this weekend.

## 2020-07-28 NOTE — Telephone Encounter (Signed)
Contacted patient and scheduled appt.

## 2020-07-31 ENCOUNTER — Telehealth: Payer: Self-pay | Admitting: Family Medicine

## 2020-07-31 NOTE — Telephone Encounter (Signed)
    Transitional Care Management  Contact Attempt Attempt Date:07/31/2020 Attempted By: Brynda Peon CMA  1st unsuccessful TCM contact attempt.   I reached out to David Bartlett on his preferred telephone number to discuss Transitional Care Management, medication reconciliation, and to schedule a TCM hospital follow-up with his PCP at [facility].  Discharge Date: 07/30/2020 Location: [facility] Discharge Dx: Esophageal varices with bleeding   Recommendations for Outpatient Follow-up:  [facility] SIGN OFF NOTE  EGD showed   Recommendations - omeprazole 40mg  BID  - send h pylori serology, treat if positive - avoid NSAIDs, decrease etoh - follow up with his local GI for further workup of his liver disease, no signs of decompensation currently - if re-bleeds please reach out to GI for repeat endoscopy  Thank you for this consult. Will sign off at this time  JOSHUA Morton Amy, MD Gastroenterology Fellow  If you have any questions or concerns, please contact the covering GI fellow at the functional pager listed below: [phone]   Plan I left a HIPPA compliant message for him to return my call.  Will attempt to contact again within the 2 business day post discharge window if he does not return my call.

## 2020-08-01 NOTE — Telephone Encounter (Signed)
TRANSITIONAL CARE MANAGEMENT TELEPHONE OUTREACH NOTE   Contact Date: 08/01/2020 Contacted By: Lynnea Ferrier, LPN   DISCHARGE INFORMATION Date of Discharge:07/30/2020 Discharge Facility: [facility] Principal Discharge Diagnosis:Esophageal varices with bleeding   Outpatient Follow Up Recommendations (copied from discharge summary) [facility] SIGN OFF NOTE  EGD showed   Recommendations - omeprazole 40mg  BID  - send h pylori serology, treat if positive - avoid NSAIDs, decrease etoh - follow up with his local GI for further workup of his liver disease, no signs of decompensation currently - if re-bleeds please reach out to GI for repeat endoscopy   David Bartlett is a male primary care patient of Janora Norlander, DO. An outgoing telephone call was made today and I spoke with patient.  Mr. Robarts condition(s) and treatment(s) were discussed. An opportunity to ask questions was provided and all were answered or forwarded as appropriate.    ACTIVITIES OF DAILY LIVING  NUR KRASINSKI lives with their spouse and he can perform ADLs independently. his primary caregiver is himself. he is able to depend on his primary caregiver(s) for consistent help. Transportation to appointments, to pick up medications, and to run errands is not a problem.  (Consider referral to [place] if transportation or a consistent caregiver is a problem)   Fall Risk Fall Risk  06/01/2020 12/29/2018  Falls in the past year? 0 0    low [place] Modifications/Assistive Devices Wheelchair: No Cane: No Ramp: No Bedside Toilet: No Hospital Bed:  No Other:    [place] he is not receiving home health services.     MEDICATION RECONCILIATION  Mr. Diana has been able to pick-up all prescribed discharge medications from the pharmacy.   A post discharge medication reconciliation was performed and the complete medication list was reviewed with the patient/caregiver  and is current as of 08/01/2020. Changes highlighted below.  Discontinued Medications Aspirin  Current Medication List Allergies as of 07/31/2020      Reactions   Penicillins Other (See Comments)   Under arms swell      Medication List       Accurate as of July 31, 2020 11:59 PM. If you have any questions, ask your nurse or doctor.        albuterol 108 (90 Base) MCG/ACT inhaler Commonly known as: VENTOLIN HFA USE 2 PUFFS EVERY 6 HOURS AS NEEDED   amLODipine 10 MG tablet Commonly known as: NORVASC Take 1 tablet (10 mg total) by mouth daily.   Anoro Ellipta 62.5-25 MCG/INH Aepb Generic drug: umeclidinium-vilanterol USE 1 INHALATION DAILY   aspirin 81 MG chewable tablet Chew 1 tablet (81 mg total) by mouth 2 (two) times daily.   atorvastatin 20 MG tablet Commonly known as: LIPITOR TAKE ONE (1) TABLET EACH DAY   Cetirizine HCl 10 MG Tbdp Take 1 tablet by mouth daily.   cholecalciferol 1000 units tablet Commonly known as: VITAMIN D Take 1,000 Units by mouth daily.   lisinopril 10 MG tablet Commonly known as: ZESTRIL TAKE ONE (1) TABLET EACH DAY   triamterene-hydrochlorothiazide 75-50 MG tablet Commonly known as: MAXZIDE Take 1 tablet by mouth every morning.   vitamin C 250 MG tablet Commonly known as: ASCORBIC ACID Take 250 mg by mouth daily.      Protonix BID for 2 months  PATIENT EDUCATION & FOLLOW-UP PLAN  An appointment for Transitional Care Management is scheduled with Janora Norlander, DO on 08/11/2020 at 9:00am.  Take all  medications as prescribed  Contact our office by calling [phone] if you have any questions or concerns

## 2020-08-08 ENCOUNTER — Telehealth: Payer: Self-pay

## 2020-08-08 ENCOUNTER — Ambulatory Visit: Payer: 59 | Admitting: Family Medicine

## 2020-08-08 ENCOUNTER — Encounter: Payer: Self-pay | Admitting: Family Medicine

## 2020-08-08 ENCOUNTER — Other Ambulatory Visit: Payer: Self-pay

## 2020-08-08 VITALS — BP 129/81 | HR 95 | Temp 97.9°F | Ht 67.0 in | Wt 213.0 lb

## 2020-08-08 DIAGNOSIS — I700 Atherosclerosis of aorta: Secondary | ICD-10-CM | POA: Diagnosis not present

## 2020-08-08 DIAGNOSIS — I2510 Atherosclerotic heart disease of native coronary artery without angina pectoris: Secondary | ICD-10-CM | POA: Diagnosis not present

## 2020-08-08 DIAGNOSIS — K922 Gastrointestinal hemorrhage, unspecified: Secondary | ICD-10-CM | POA: Diagnosis not present

## 2020-08-08 DIAGNOSIS — Z09 Encounter for follow-up examination after completed treatment for conditions other than malignant neoplasm: Secondary | ICD-10-CM

## 2020-08-08 LAB — HEMOGLOBIN, FINGERSTICK: Hemoglobin: 10.5 g/dL — ABNORMAL LOW (ref 12.6–17.7)

## 2020-08-08 NOTE — Telephone Encounter (Signed)
Spoke with patient, he is scheduled for a follow up with Ellouise Newer - PA on 08/17/20 at 11:30 am.

## 2020-08-08 NOTE — Progress Notes (Signed)
Subjective: CC: hospital follow up PCP: Janora Norlander, DO David Bartlett is a 49 y.o. male presenting to clinic today for:  1. GI bleed Patient was admitted at [facility] for upper GI bleed.  He was found to have bleeding esophageal varices.  He was discharged on Protonix 40 mg twice daily and home aspirin was discontinued as well.  He was recommended to follow-up with GI/hepatology.  Bleed was thought to be secondary to alcohol use.  H. pylori serology was pending at discharge.   He denies any ongoing rectal bleeding or hematemesis.  He has totally discontinued aspirin and Goody powders.  He is reduced his alcohol intake dramatically and has consumed 4 beers since discharge from the hospital which was 9 days ago.  He was transfused during hospitalization.  He is compliant with Protonix twice daily.  He has yet to follow-up with hepatology/GI.  He was asking that we arrange this today.  Energy is improving.  He does not report any unusual shortness of breath, ongoing lightheadedness or dizziness.  No heart palpitations.   ROS: Per HPI  Allergies  Allergen Reactions  . Penicillins Other (See Comments)    Under arms swell   Past Medical History:  Diagnosis Date  . Abnormal EKG   . Asthmatic bronchitis   . Diabetes mellitus without complication ([place])   . HTN (hypertension)   . Left breast mass    resolved on its own   . Smoker     Current Outpatient Medications:  .  albuterol (VENTOLIN HFA) 108 (90 Base) MCG/ACT inhaler, USE 2 PUFFS EVERY 6 HOURS AS NEEDED, Disp: 18 g, Rfl: 3 .  amLODipine (NORVASC) 10 MG tablet, Take 1 tablet (10 mg total) by mouth daily., Disp: 90 tablet, Rfl: 3 .  ANORO ELLIPTA 62.5-25 MCG/INH AEPB, USE 1 INHALATION DAILY, Disp: 60 each, Rfl: 0 .  aspirin 81 MG chewable tablet, Chew 1 tablet (81 mg total) by mouth 2 (two) times daily., Disp: 60 tablet, Rfl: 3 .  atorvastatin (LIPITOR) 20 MG tablet, TAKE ONE (1) TABLET EACH DAY, Disp: 90 tablet, Rfl: 3 .   Cetirizine HCl 10 MG TBDP, Take 1 tablet by mouth daily., Disp: , Rfl:  .  cholecalciferol (VITAMIN D) 1000 units tablet, Take 1,000 Units by mouth daily., Disp: , Rfl:  .  lisinopril (ZESTRIL) 10 MG tablet, TAKE ONE (1) TABLET EACH DAY, Disp: 90 tablet, Rfl: 3 .  triamterene-hydrochlorothiazide (MAXZIDE) 75-50 MG tablet, Take 1 tablet by mouth every morning., Disp: 90 tablet, Rfl: 3 .  vitamin C (ASCORBIC ACID) 250 MG tablet, Take 250 mg by mouth daily., Disp: , Rfl:  Social History   Socioeconomic History  . Marital status: Married    Spouse name: Not on file  . Number of children: Not on file  . Years of education: Not on file  . Highest education level: Not on file  Occupational History  . Not on file  Tobacco Use  . Smoking status: Current Every Day Smoker    Packs/day: 1.00    Years: 30.00    Pack years: 30.00    Types: Cigarettes  . Smokeless tobacco: Never Used  Vaping Use  . Vaping Use: Former  Substance and Sexual Activity  . Alcohol use: Yes    Alcohol/week: 3.0 standard drinks    Types: 3 Cans of beer per week    Comment: daily  . Drug use: No  . Sexual activity: Not on file  Other Topics Concern  .  Not on file  Social History Narrative  . Not on file   Social Determinants of Health   Financial Resource Strain:   . Difficulty of Paying Living Expenses: Not on file  Food Insecurity:   . Worried About Charity fundraiser in the Last Year: Not on file  . Ran Out of Food in the Last Year: Not on file  Transportation Needs:   . Lack of Transportation (Medical): Not on file  . Lack of Transportation (Non-Medical): Not on file  Physical Activity:   . Days of Exercise per Week: Not on file  . Minutes of Exercise per Session: Not on file  Stress:   . Feeling of Stress : Not on file  Social Connections:   . Frequency of Communication with Friends and Family: Not on file  . Frequency of Social Gatherings with Friends and Family: Not on file  . Attends Religious  Services: Not on file  . Active Member of Clubs or Organizations: Not on file  . Attends Archivist Meetings: Not on file  . Marital Status: Not on file  Intimate Partner Violence:   . Fear of Current or Ex-Partner: Not on file  . Emotionally Abused: Not on file  . Physically Abused: Not on file  . Sexually Abused: Not on file   Family History  Problem Relation Age of Onset  . Kidney disease Mother   . Heart attack Mother 53       died  . Cancer Father   . Heart attack Father 40       died  . Diabetes Sister   . Diabetes Maternal Grandmother   . Colon cancer Neg Hx   . Esophageal cancer Neg Hx   . Stomach cancer Neg Hx   . Pancreatic cancer Neg Hx     Objective: Office vital signs reviewed. There were no vitals taken for this visit.  Physical Examination:  General: Awake, alert, appears pale, No acute distress HEENT: Normal, mild conjunctival pallor Cardio: regular rate and rhythm, S1S2 heard, no murmurs appreciated Pulm: clear to auscultation bilaterally, no wheezes, rhonchi or rales; normal work of breathing on room air Extremities: warm, well perfused, No edema, cyanosis or clubbing; +2 pulses bilaterally MSK: normal gait and station  Assessment/ Plan: 49 y.o. male   1. Upper GI bleeding I agree with discontinuing the baby aspirin given GI bleed.  I have reviewed his discharge summary and hospital course.  His H. pylori testing was negative.  Given CT findings of advanced for age coronary artery and atherosclerotic disease noted and family h/o CAD, I am placing referral to cardiology for stress testing.  Hgb 10.5 today. This is up from discharge - CBC  2. Hospital discharge follow-up I reviewed his CT scan results from [place] with the patient.  There is concern for hepatic cirrhosis.  3. Coronary artery disease involving native coronary artery of native heart without angina pectoris - Ambulatory referral to Cardiology  4. Atherosclerosis of aorta ([place]) -  Ambulatory referral to Cardiology   No orders of the defined types were placed in this encounter.  No orders of the defined types were placed in this encounter.   Out of work for at least the next few days.  I worry that he still hasn't recovered enough to return to a high pace/ stress/ heat environment.  Janora Norlander, DO [place] [phone]

## 2020-08-08 NOTE — Telephone Encounter (Signed)
-----   Message from [place], [place] sent at 08/08/2020  3:07 PM EDT ----- Regarding: needs follow up visit Patient needs soon follow up with me or other app in next 1-2 weeks after being seen at [facility] for gi bleed and new dx of cirrhosis  Thanks-JLL

## 2020-08-09 ENCOUNTER — Other Ambulatory Visit: Payer: Self-pay | Admitting: Family Medicine

## 2020-08-09 DIAGNOSIS — R062 Wheezing: Secondary | ICD-10-CM

## 2020-08-09 LAB — CBC
Hematocrit: 31.6 % — ABNORMAL LOW (ref 37.5–51.0)
Hemoglobin: 10.7 g/dL — ABNORMAL LOW (ref 13.0–17.7)
MCH: 31.3 pg (ref 26.6–33.0)
MCHC: 33.9 g/dL (ref 31.5–35.7)
MCV: 92 fL (ref 79–97)
Platelets: 253 10*3/uL (ref 150–450)
RBC: 3.42 x10E6/uL — ABNORMAL LOW (ref 4.14–5.80)
RDW: 14.7 % (ref 11.6–15.4)
WBC: 8.6 10*3/uL (ref 3.4–10.8)

## 2020-08-11 ENCOUNTER — Ambulatory Visit: Payer: 59 | Admitting: Family Medicine

## 2020-08-16 ENCOUNTER — Ambulatory Visit (INDEPENDENT_AMBULATORY_CARE_PROVIDER_SITE_OTHER): Payer: 59 | Admitting: Physician Assistant

## 2020-08-16 ENCOUNTER — Encounter: Payer: Self-pay | Admitting: Physician Assistant

## 2020-08-16 ENCOUNTER — Other Ambulatory Visit (INDEPENDENT_AMBULATORY_CARE_PROVIDER_SITE_OTHER): Payer: 59

## 2020-08-16 VITALS — BP 142/70 | HR 104 | Ht 67.0 in | Wt 221.0 lb

## 2020-08-16 DIAGNOSIS — K3189 Other diseases of stomach and duodenum: Secondary | ICD-10-CM

## 2020-08-16 DIAGNOSIS — Z8711 Personal history of peptic ulcer disease: Secondary | ICD-10-CM

## 2020-08-16 DIAGNOSIS — Z1211 Encounter for screening for malignant neoplasm of colon: Secondary | ICD-10-CM | POA: Diagnosis not present

## 2020-08-16 DIAGNOSIS — Z1212 Encounter for screening for malignant neoplasm of rectum: Secondary | ICD-10-CM

## 2020-08-16 DIAGNOSIS — Z8719 Personal history of other diseases of the digestive system: Secondary | ICD-10-CM

## 2020-08-16 DIAGNOSIS — K7460 Unspecified cirrhosis of liver: Secondary | ICD-10-CM | POA: Diagnosis not present

## 2020-08-16 DIAGNOSIS — K766 Portal hypertension: Secondary | ICD-10-CM

## 2020-08-16 LAB — CBC WITH DIFFERENTIAL/PLATELET
Basophils Absolute: 0 10*3/uL (ref 0.0–0.1)
Basophils Relative: 0.1 % (ref 0.0–3.0)
Eosinophils Absolute: 0.1 10*3/uL (ref 0.0–0.7)
Eosinophils Relative: 2.1 % (ref 0.0–5.0)
HCT: 34.1 % — ABNORMAL LOW (ref 39.0–52.0)
Hemoglobin: 11.5 g/dL — ABNORMAL LOW (ref 13.0–17.0)
Lymphocytes Relative: 40.7 % (ref 12.0–46.0)
Lymphs Abs: 2.7 10*3/uL (ref 0.7–4.0)
MCHC: 33.7 g/dL (ref 30.0–36.0)
MCV: 96.6 fl (ref 78.0–100.0)
Monocytes Absolute: 1 10*3/uL (ref 0.1–1.0)
Monocytes Relative: 14.6 % — ABNORMAL HIGH (ref 3.0–12.0)
Neutro Abs: 2.9 10*3/uL (ref 1.4–7.7)
Neutrophils Relative %: 42.5 % — ABNORMAL LOW (ref 43.0–77.0)
Platelets: 150 10*3/uL (ref 150.0–400.0)
RBC: 3.53 Mil/uL — ABNORMAL LOW (ref 4.22–5.81)
RDW: 17.4 % — ABNORMAL HIGH (ref 11.5–15.5)
WBC: 6.8 10*3/uL (ref 4.0–10.5)

## 2020-08-16 LAB — COMPREHENSIVE METABOLIC PANEL
ALT: 20 U/L (ref 0–53)
AST: 33 U/L (ref 0–37)
Albumin: 4.1 g/dL (ref 3.5–5.2)
Alkaline Phosphatase: 85 U/L (ref 39–117)
BUN: 8 mg/dL (ref 6–23)
CO2: 25 mEq/L (ref 19–32)
Calcium: 8.9 mg/dL (ref 8.4–10.5)
Chloride: 101 mEq/L (ref 96–112)
Creatinine, Ser: 0.65 mg/dL (ref 0.40–1.50)
GFR: 129.97 mL/min (ref >60.00)
Glucose, Bld: 74 mg/dL (ref 70–99)
Potassium: 4 mEq/L (ref 3.5–5.1)
Sodium: 135 mEq/L (ref 135–145)
Total Bilirubin: 0.4 mg/dL (ref 0.2–1.2)
Total Protein: 6.9 g/dL (ref 6.0–8.3)

## 2020-08-16 LAB — PROTIME-INR
INR: 1.1 ratio — ABNORMAL HIGH (ref 0.8–1.0)
Prothrombin Time: 11.8 s (ref 9.6–13.1)

## 2020-08-16 LAB — IBC + FERRITIN
Ferritin: 174.6 ng/mL (ref 22.0–322.0)
Iron: 311 ug/dL — ABNORMAL HIGH (ref 42–165)
Saturation Ratios: 60 % — ABNORMAL HIGH (ref 20.0–50.0)
Transferrin: 370 mg/dL — ABNORMAL HIGH (ref 212.0–360.0)

## 2020-08-16 MED ORDER — SUTAB 1479-225-188 MG PO TABS: 1.0000 | ORAL_TABLET | Freq: Once | ORAL | 0 refills | Status: AC

## 2020-08-16 NOTE — Progress Notes (Signed)
Chief Complaint: Follow-up after admission for GI bleed and new diagnosis of cirrhosis  HPI:    David Bartlett is a 49 year old male with a past medical history as listed below, assigned to Dr. Rush Landmark at last visit, who presents to clinic today for follow-up after being admitted at [facility] with a GI bleed and being diagnosed with cirrhosis.        07/27/2020 patient admitted to [facility] with rectal bleeding and abdominal pain.  Presented with a hemoglobin of 8.9 (previously 15.4), CT showed cirrhotic liver (new diagnosis) and evidence of varices.  He was started on IV Protonix, octreotide and Rocephin.  He was given 1 unit of O+ PRBCs.  He was transferred to [facility] for further management.  He reported heavy alcohol use, 12 pack a day but cut back in the past 3 years with about 3 drinks per day.  Also described BC powders.  He was diagnosed with upper GI bleed, though no clear source was found during EGD.Marland Kitchen  Was recommended he have a follow-up with GI/hepatology for a FibroScan and further assessment of liver status.  He was started on Protonix 40 mg twice daily.    07/27/2020 CT showed nodular liver surface contour suggestive of chronic liver disease with fibrosis.  Small caliber upper abdominal portosystemic collaterals are likely sequelae of portal venous hypertension.    EGD 07/28/2020 with LA grade a esophagitis with no bleeding, small hiatal hernia, portal hypertensive gastropathy, nonbleeding gastric ulcer with no stigmata of bleeding, nonbleeding duodenal ulcers with no stigmata of bleeding and no clear source of bleeding, query some mucosal deal of void.  No active bleeding during exam.    Today, the patient presents to clinic and tells me that he feels much better after being in the hospital.  Prior to going he tells me he had lost "a lot of blood ".  He explains that he works as a Training and development officer and has been downing at least 2 BC powders a day due to some hip pain he was experiencing from being on his feet all day.   Tells me he continues to drink but only about 2-3 beers a day which is decreased over the past few years.  Since being out of the hospital he has seen no further melena and had no real problems.  Tells me he saw his PCP who assured him that his hemoglobin was going back up.  Continues on pantoprazole 40 twice daily.    Denies fever, chills, weight loss, change in bowel habits, abdominal pain or symptoms that awaken him from sleep.  Past Medical History:  Diagnosis Date  . Abnormal EKG   . Asthmatic bronchitis   . Diabetes mellitus without complication ([place])   . HTN (hypertension)   . Left breast mass    resolved on its own   . Smoker     Past Surgical History:  Procedure Laterality Date  . JOINT REPLACEMENT    . NO PAST SURGERIES    . 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 albuterol (VENTOLIN HFA) 108 (90 Base) MCG/ACT inhaler USE 2 PUFFS EVERY 6 HOURS AS NEEDED 18 g 3  . amLODipine (NORVASC) 10 MG tablet Take 1 tablet (10 mg total) by mouth daily. 90 tablet 3  . ANORO ELLIPTA  62.5-25 MCG/INH AEPB USE 1 INHALATION DAILY 60 each 0  . atorvastatin (LIPITOR) 20 MG tablet TAKE ONE (1) TABLET EACH DAY 90 tablet 3  . Cetirizine HCl 10 MG TBDP Take 1 tablet by mouth daily.    . cholecalciferol (VITAMIN D) 1000 units tablet Take 1,000 Units by mouth daily.    Marland Kitchen lisinopril (ZESTRIL) 10 MG tablet TAKE ONE (1) TABLET EACH DAY 90 tablet 3  . pantoprazole (PROTONIX) 40 MG tablet Take by mouth.    . triamterene-hydrochlorothiazide (MAXZIDE) 75-50 MG tablet Take 1 tablet by mouth every morning. 90 tablet 3  . vitamin C (ASCORBIC ACID) 250 MG tablet Take 250 mg by mouth daily.     No current facility-administered medications for this visit.    Allergies as of  08/16/2020 - Review Complete 08/08/2020  Allergen Reaction Noted  . Penicillins Other (See Comments) 05/16/2011    Family History  Problem Relation Age of Onset  . Kidney disease Mother   . Heart attack Mother 50       died  . Cancer Father   . Heart attack Father 35       died  . Diabetes Sister   . Diabetes Maternal Grandmother   . Colon cancer Neg Hx   . Esophageal cancer Neg Hx   . Stomach cancer Neg Hx   . Pancreatic cancer Neg Hx     Social History   Socioeconomic History  . Marital status: Married    Spouse name: Not on file  . Number of children: Not on file  . Years of education: Not on file  . Highest education level: Not on file  Occupational History  . Not on file  Tobacco Use  . Smoking status: Current Every Day Smoker    Packs/day: 1.00    Years: 30.00    Pack years: 30.00    Types: Cigarettes  . Smokeless tobacco: Never Used  Vaping Use  . Vaping Use: Former  Substance and Sexual Activity  . Alcohol use: Yes    Alcohol/week: 3.0 standard drinks    Types: 3 Cans of beer per week    Comment: daily  . Drug use: No  . Sexual activity: Not on file  Other Topics Concern  . Not on file  Social History Narrative  . Not on file   Social Determinants of Health   Financial Resource Strain:   . Difficulty of Paying Living Expenses: Not on file  Food Insecurity:   . Worried About Charity fundraiser in the Last Year: Not on file  . Ran Out of Food in the Last Year: Not on file  Transportation Needs:   . Lack of Transportation (Medical): Not on file  . Lack of Transportation (Non-Medical): Not on file  Physical Activity:   . Days of Exercise per Week: Not on file  . Minutes of Exercise per Session: Not on file  Stress:   . Feeling of Stress : Not on file  Social Connections:   . Frequency of Communication with Friends and Family: Not on file  . Frequency of Social Gatherings with Friends and Family: Not on file  . Attends Religious Services: Not  on file  . Active Member of Clubs or Organizations: Not on file  . Attends Archivist Meetings: Not on file  . Marital Status: Not on file  Intimate Partner Violence:   . Fear of Current or Ex-Partner: Not on file  . Emotionally Abused: Not on file  .  Physically Abused: Not on file  . Sexually Abused: Not on file    Review of Systems:    Constitutional: No weight loss, fever or chills Cardiovascular: No chest pain  Respiratory: No SOB Gastrointestinal: See HPI and otherwise negative   Physical Exam:  Vital signs: BP (!) 142/70   Pulse (!) 104   Ht '5\' 7"'  (1.702 m)   Wt 221 lb (100.2 kg)   BMI 34.61 kg/m   Constitutional:   Pleasant overweight Caucasian male appears to be in NAD, Well developed, Well nourished, alert and cooperative Respiratory: Respirations even and unlabored. Lungs clear to auscultation bilaterally.   No wheezes, crackles, or rhonchi.  Cardiovascular: Normal S1, S2. No MRG. Regular rate and rhythm. No peripheral edema, cyanosis or pallor.  Gastrointestinal:  Soft, nondistended, nontender. No rebound or guarding. Normal bowel sounds. No appreciable masses or hepatomegaly. Rectal:  Not performed.  Psychiatric: Demonstrates good judgement and reason without abnormal affect or behaviors.  RELEVANT LABS AND IMAGING: CBC    Component Value Date/Time   WBC 8.6 08/08/2020 1418   WBC 7.6 02/18/2020 0937   RBC 3.42 (L) 08/08/2020 1418   RBC 4.71 02/18/2020 0937   HGB 10.7 (L) 08/08/2020 1418   HCT 31.6 (L) 08/08/2020 1418   PLT 253 08/08/2020 1418   MCV 92 08/08/2020 1418   MCH 31.3 08/08/2020 1418   MCH 30.6 08/08/2017 0613   MCHC 33.9 08/08/2020 1418   MCHC 35.2 02/18/2020 0937   RDW 14.7 08/08/2020 1418   LYMPHSABS 1.7 02/18/2020 0937   LYMPHSABS 4.3 (H) 07/31/2018 1551   MONOABS 1.0 02/18/2020 0937   EOSABS 0.0 02/18/2020 0937   EOSABS 0.2 07/31/2018 1551   BASOSABS 0.1 02/18/2020 0937   BASOSABS 0.0 07/31/2018 1551    CMP     Component  Value Date/Time   NA 132 (L) 02/18/2020 0937   NA 141 02/08/2020 1013   K 4.0 02/18/2020 0937   CL 95 (L) 02/18/2020 0937   CO2 24 02/18/2020 0937   GLUCOSE 121 (H) 02/18/2020 0937   BUN 8 02/18/2020 0937   BUN 6 02/08/2020 1013   CREATININE 0.70 02/18/2020 0937   CALCIUM 9.1 02/18/2020 0937   PROT 7.8 02/18/2020 0937   PROT 6.9 02/08/2020 1013   ALBUMIN 4.4 02/18/2020 0937   ALBUMIN 4.4 02/08/2020 1013   AST 84 (H) 02/18/2020 0937   ALT 83 (H) 02/18/2020 0937   ALKPHOS 91 02/18/2020 0937   BILITOT 0.5 02/18/2020 0937   BILITOT 0.3 02/08/2020 1013   GFRNONAA 108 02/08/2020 1013   GFRAA 125 02/08/2020 1013    Assessment: 1.  History of gastric and duodenal ulcers: Found during recent hospitalization for upper GI bleed on EGD, also found portal hypertensive gastropathy, no clear source of bleeding, but likely a combination of all of these 2.  Portal hypertensive gastropathy 3.  Screening for colorectal cancer: Patient has never had a colonoscopy and is 49 years old 12.  Cirrhosis: New diagnosis, previous work-up in March with extremely elevated Ferritin but otherwise normal, now CT showing cirrhosis and portal hypertensive gastropathy on EGD; most likely due to alcohol use  Plan: 1.  Scheduled the patient for a repeat EGD for surveillance of his gastric ulcers as well as a screening colonoscopy in 2 months from now with Dr. Rush Landmark.  Did discuss risks, benefits, limitations and alternatives and patient agrees to proceed.  He has had his Covid vaccine in the hospital. 2.  Discussed cirrhosis and the sequelae of  this disease.  Told him to abstain from all alcohol use. 3.  Ordered an ultrasound with elastography for further definition of cirrhosis. 4.  Ordered labs today to include repeat iron studies and ferritin as well as hemochromatosis as he was supposed to have this done last time, repeat CBC, CMP, PT and INR.  5.  Continue Pantoprazole 40 mg twice daily 6.  Patient should  follow in clinic after procedures with Dr. Rush Landmark.  It would be nice for him to follow with Dr. Rush Landmark himself as he has not met him in a clinic setting and now has become more complex.  Ellouise Newer, PA-C [facility] 08/16/2020, 10:03 AM  Cc: Janora Norlander, DO

## 2020-08-16 NOTE — Patient Instructions (Signed)
If you are age 50 or older, your body mass index should be between 23-30. Your Body mass index is 34.61 kg/m. If this is out of the aforementioned range listed, please consider follow up with your Primary Care Provider.  If you are age 67 or younger, your body mass index should be between 19-25. Your Body mass index is 34.61 kg/m. If this is out of the aformentioned range listed, please consider follow up with your Primary Care Provider.   Your provider has requested that you go to the basement level for lab work before leaving today. Press "B" on the elevator. The lab is located at the first door on the left as you exit the elevator.  Continue Pantoprazole.   You have been scheduled for an abdominal ultrasound at [facility] (1st floor of hospital) on Thursday 08/24/20 at 9:30 am. Please arrive 15 minutes prior to your appointment for registration. Make certain not to have anything to eat or drink 6 hours prior to your appointment. Should you need to reschedule your appointment, please contact radiology at [phone]. This test typically takes about 30 minutes to perform.  You have been scheduled for an endoscopy and colonoscopy. Please follow the written instructions given to you at your visit today. Please pick up your prep supplies at the pharmacy within the next 1-3 days. If you use inhalers (even only as needed), please bring them with you on the day of your procedure.  Due to recent changes in healthcare laws, you may see the results of your imaging and laboratory studies on MyChart before your provider has had a chance to review them.  We understand that in some cases there may be results that are confusing or concerning to you. Not all laboratory results come back in the same time frame and the provider may be waiting for multiple results in order to interpret others.  Please give [place] 48 hours in order for your provider to thoroughly review all the results before contacting the  office for clarification of your results.

## 2020-08-17 ENCOUNTER — Ambulatory Visit: Payer: 59 | Admitting: Physician Assistant

## 2020-08-17 LAB — AFP TUMOR MARKER: AFP-Tumor Marker: 5.9 ng/mL (ref <6.1)

## 2020-08-22 NOTE — Progress Notes (Signed)
Attending Physician's Attestation   I have reviewed the chart.   I agree with the Advanced Practitioner's note, impression, and recommendations with any updates as below.    Damita Eppard Mansouraty, MD [facility] Advanced Endoscopy Office # [phone]  

## 2020-08-24 ENCOUNTER — Ambulatory Visit (HOSPITAL_COMMUNITY): Payer: 59

## 2020-08-28 ENCOUNTER — Ambulatory Visit (INDEPENDENT_AMBULATORY_CARE_PROVIDER_SITE_OTHER): Payer: 59 | Admitting: Internal Medicine

## 2020-08-28 ENCOUNTER — Other Ambulatory Visit: Payer: Self-pay

## 2020-08-28 ENCOUNTER — Ambulatory Visit (HOSPITAL_COMMUNITY)
Admission: RE | Admit: 2020-08-28 | Discharge: 2020-08-28 | Disposition: A | Discharge status: Home / Self Care | Payer: 59 | Source: Ambulatory Visit | Attending: Physician Assistant | Admitting: Physician Assistant

## 2020-08-28 ENCOUNTER — Other Ambulatory Visit: Payer: Self-pay | Admitting: Family Medicine

## 2020-08-28 ENCOUNTER — Telehealth: Payer: Self-pay | Admitting: Family Medicine

## 2020-08-28 ENCOUNTER — Encounter: Payer: Self-pay | Admitting: Internal Medicine

## 2020-08-28 VITALS — BP 138/50 | HR 91 | Ht 67.0 in | Wt 216.0 lb

## 2020-08-28 DIAGNOSIS — I700 Atherosclerosis of aorta: Secondary | ICD-10-CM | POA: Diagnosis not present

## 2020-08-28 DIAGNOSIS — K3189 Other diseases of stomach and duodenum: Secondary | ICD-10-CM | POA: Diagnosis present

## 2020-08-28 DIAGNOSIS — K766 Portal hypertension: Secondary | ICD-10-CM | POA: Insufficient documentation

## 2020-08-28 DIAGNOSIS — R062 Wheezing: Secondary | ICD-10-CM

## 2020-08-28 DIAGNOSIS — K7460 Unspecified cirrhosis of liver: Secondary | ICD-10-CM | POA: Diagnosis present

## 2020-08-28 DIAGNOSIS — I10 Essential (primary) hypertension: Secondary | ICD-10-CM

## 2020-08-28 DIAGNOSIS — E7801 Familial hypercholesterolemia: Secondary | ICD-10-CM | POA: Diagnosis not present

## 2020-08-28 DIAGNOSIS — E1169 Type 2 diabetes mellitus with other specified complication: Secondary | ICD-10-CM | POA: Diagnosis not present

## 2020-08-28 DIAGNOSIS — E785 Hyperlipidemia, unspecified: Secondary | ICD-10-CM

## 2020-08-28 NOTE — Progress Notes (Signed)
Cardiology Office Note:    Date:  08/28/2020   ID:  David Bartlett, DOB 01/15/1971, MRN [id number]  PCP:  Janora Norlander, DO  [place] Cardiologist:  No primary care provider on file.  [facility] Electrophysiologist:  None   Referring MD: Janora Norlander, DO   Seen for Coronary calcium. Seen in consultation for evaluation of coronary artery calcium by Dr. Lajuana Ripple  History of Present Illness:    David Bartlett is a 49 y.o. male with a hx of recent GI Bleed and diagnosis of Cirrhosis and Varices (per Chart Review), Diabetes Mellitus not on insulin, HLD, Early history of CAD in mother (with HF and ESRD) and father.  Patient has recent GI bleed issues.  Found esophageal varices and alcoholic cirrhosis.  Incidentally found coronary artery calcifications.  No chest pain, shortness of breath, anginal, and PND, DOE.  No syncope.  Never had chest pain.  No chest tightness.  Able to do ADLs and able to take care of grandkids at home  No bleeding since discharge.  No bloody stools.  Doesn't feel weak.  Past Medical History:  Diagnosis Date   Abnormal EKG    Asthmatic bronchitis    Diabetes mellitus without complication (HCC)    HTN (hypertension)    Left breast mass    resolved on its own    Smoker     Past Surgical History:  Procedure Laterality Date   JOINT REPLACEMENT     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/07/2017   Procedure: LEFT TOTAL HIP ARTHROPLASTY ANTERIOR APPROACH;  Surgeon: Rod Can, MD;  Location: [facility];  Service: Orthopedics;  Laterality: Left;  Needs RNFA    Current Medications: Current Meds  Medication Sig   albuterol (VENTOLIN HFA) 108 (90 Base) MCG/ACT inhaler USE 2 PUFFS EVERY 6 HOURS AS NEEDED   amLODipine (NORVASC) 10 MG tablet Take 1 tablet (10 mg total) by mouth daily.   ANORO ELLIPTA 62.5-25 MCG/INH AEPB USE 1 INHALATION DAILY   Cetirizine HCl 10 MG TBDP Take 1 tablet by mouth daily.   cholecalciferol (VITAMIN D) 1000  units tablet Take 1,000 Units by mouth daily.   lisinopril (ZESTRIL) 10 MG tablet TAKE ONE (1) TABLET EACH DAY   pantoprazole (PROTONIX) 40 MG tablet Take 40 mg by mouth 2 (two) times daily.    triamterene-hydrochlorothiazide (MAXZIDE) 75-50 MG tablet Take 1 tablet by mouth every morning.   vitamin C (ASCORBIC ACID) 250 MG tablet Take 250 mg by mouth daily.     Allergies:   Penicillins   Social History   Socioeconomic History   Marital status: Married    Spouse name: Not on file   Number of children: Not on file   Years of education: Not on file   Highest education level: Not on file  Occupational History   Not on file  Tobacco Use   Smoking status: Current Every Day Smoker    Packs/day: 1.00    Years: 30.00    Pack years: 30.00    Types: Cigarettes   Smokeless tobacco: Never Used  Vaping Use   Vaping Use: Former  Substance and Sexual Activity   Alcohol use: Yes    Alcohol/week: 2.0 standard drinks    Types: 2 Cans of beer per week    Comment: daily   Drug use: No   Sexual activity: Not on file  Other Topics Concern   Not on file  Social History Narrative   Not on file  Social Determinants of Health   Financial Resource Strain:    Difficulty of Paying Living Expenses: Not on file  Food Insecurity:    Worried About Charity fundraiser in the Last Year: Not on file   [employer] of Food in the Last Year: Not on file  Transportation Needs:    Lack of Transportation (Medical): Not on file   Lack of Transportation (Non-Medical): Not on file  Physical Activity:    Days of Exercise per Week: Not on file   Minutes of Exercise per Session: Not on file  Stress:    Feeling of Stress : Not on file  Social Connections:    Frequency of Communication with Friends and Family: Not on file   Frequency of Social Gatherings with Friends and Family: Not on file   Attends Religious Services: Not on file   Active Member of Clubs or Organizations: Not on  file   Attends Archivist Meetings: Not on file   Marital Status: Not on file    Family History: The patient's family history includes Cancer in his father; Diabetes in his maternal grandmother and sister; Heart attack (age of onset: 20) in his mother; Heart attack (age of onset: 95) in his father; Kidney disease in his mother. There is no history of Colon cancer, Esophageal cancer, Stomach cancer, or Pancreatic cancer.  ROS:   Please see the history of present illness.    All other systems reviewed and are negative.  EKGs/Labs/Other Studies Reviewed:    The following studies were reviewed today:  EKG:  EKG is  ordered today.  The ekg ordered today demonstrates sinus rhythm rate 91, no pathologic q wave, nonspecific TW flattening 08/01/2017- sinus rhythm rate 83, nonspecific ST/T wave changes.  Recent Labs: 08/16/2020: ALT 20; BUN 8; Creatinine, Ser 0.65; Hemoglobin 11.5; Platelets 150.0; Potassium 4.0; Sodium 135  Recent Lipid Panel    Component Value Date/Time   CHOL 152 08/11/2019 1332   TRIG 134 08/11/2019 1332   HDL 57 08/11/2019 1332   CHOLHDL 2.7 08/11/2019 1332   LDLCALC 68 08/11/2019 1332   Hgb trend 15.9, 10.7, 11.5 2021 Stress Echo (06/07/2011)- 85% max predicted, no ST changes, no WMAs. [facility] CT Scan 07/27/20- coronary artery and aortic calcium.    Physical Exam:    VS:  BP (!) 138/50 (BP Location: Right Arm, Patient Position: Sitting, Cuff Size: Normal)    Pulse 91    Ht 5\' 7"  (1.702 m)    Wt 216 lb (98 kg)    SpO2 95%    BMI 33.83 kg/m     Wt Readings from Last 3 Encounters:  08/28/20 216 lb (98 kg)  08/16/20 221 lb (100.2 kg)  08/08/20 213 lb (96.6 kg)     GEN: Well nourished, well developed in no acute distress HEENT: Bilateral Frank's Sign NECK: No JVD; No carotid bruits LYMPHATICS: No lymphadenopathy CARDIAC:RRR, no murmurs, rubs, gallops RESPIRATORY:  Clear to auscultation without rales, wheezing or rhonchi  ABDOMEN: Soft, non-tender,  non-distended MUSCULOSKELETAL:  No edema; No deformity  SKIN: Warm and dry NEUROLOGIC:  Alert and oriented x 3 PSYCHIATRIC:  Normal affect   ASSESSMENT:    1. Atherosclerosis of aorta ([place])   2. Familial hypercholesterolemia   3. Essential hypertension   4. Hyperlipidemia associated with type 2 diabetes mellitus ([place])    PLAN:    In order of problems listed above:  1. Coronary artery calcium and aortic calcium 2. Diabetes mellitus with hypertension -reasonable  control present medication 3. Active Tobacco Use & Alcohol Use 4. Blood loss anemia 5. Alcoholic Cirrhosis, gastric and duodenal ulcers, porta hypertensive gastropathy - continue current antihypertensives and lipitor - asymptomatic CAC and aortic calcium - gave education on tobacco cessation; discussed weight loss and exercise for 15 minutes - OK to start back to work from cardiac perspective  Will see in one year unless new sx occur or blood pressure worsens  Medication Adjustments/Labs and Tests Ordered: Current medicines are reviewed at length with the patient today.  Concerns regarding medicines are outlined above.  No orders of the defined types were placed in this encounter.  No orders of the defined types were placed in this encounter.   There are no Patient Instructions on file for this visit.   Signed, Werner Lean, MD  08/28/2020 11:37 AM    [place]

## 2020-08-28 NOTE — Telephone Encounter (Signed)
Dr. Darnell Level is off all week. Cardiology and GI notes in chart?

## 2020-08-28 NOTE — Patient Instructions (Signed)
Medication Instructions:   *If you need a refill on your cardiac medications before your next appointment, please call your pharmacy*   Lab Work:  If you have labs (blood work) drawn today and your tests are completely normal, you will receive your results only by: Marland Kitchen MyChart Message (if you have MyChart) OR . A paper copy in the mail If you have any lab test that is abnormal or we need to change your treatment, we will call you to review the results.   Testing/Procedures:    Follow-Up: At [facility], you and your health needs are our priority.  As part of our continuing mission to provide you with exceptional heart care, we have created designated Provider Care Teams.  These Care Teams include your primary Cardiologist (physician) and Advanced Practice Providers (APPs -  Physician Assistants and Nurse Practitioners) who all work together to provide you with the care you need, when you need it.  We recommend signing up for the patient portal called "MyChart".  Sign up information is provided on this After Visit Summary.  MyChart is used to connect with patients for Virtual Visits (Telemedicine).  Patients are able to view lab/test results, encounter notes, upcoming appointments, etc.  Non-urgent messages can be sent to your provider as well.   To learn more about what you can do with MyChart, go to [email or web address].    Your next appointment:   1 year(s)  The format for your next appointment:   In Person  Provider:   Gasper Sells   Other Instructions

## 2020-08-28 NOTE — Telephone Encounter (Signed)
Ok for note to return to work. 

## 2020-08-29 ENCOUNTER — Telehealth: Payer: Self-pay | Admitting: Family Medicine

## 2020-08-29 MED ORDER — ANORO ELLIPTA 62.5-25 MCG/INH IN AEPB: 1.0000 | INHALATION_SPRAY | Freq: Every day | RESPIRATORY_TRACT | 1 refills | Status: DC

## 2020-08-30 NOTE — Telephone Encounter (Signed)
Letter written and left up front for patient pick up. Patient notified and verbalized understanding 

## 2020-09-04 NOTE — Telephone Encounter (Signed)
Patient never returned call  

## 2020-10-03 ENCOUNTER — Encounter: Payer: Self-pay | Admitting: Gastroenterology

## 2020-10-03 ENCOUNTER — Ambulatory Visit (AMBULATORY_SURGERY_CENTER): Payer: 59 | Admitting: Gastroenterology

## 2020-10-03 ENCOUNTER — Other Ambulatory Visit: Payer: Self-pay

## 2020-10-03 VITALS — BP 107/69 | HR 67 | Temp 98.1°F | Resp 14

## 2020-10-03 DIAGNOSIS — Z8719 Personal history of other diseases of the digestive system: Secondary | ICD-10-CM

## 2020-10-03 DIAGNOSIS — D124 Benign neoplasm of descending colon: Secondary | ICD-10-CM

## 2020-10-03 DIAGNOSIS — D128 Benign neoplasm of rectum: Secondary | ICD-10-CM

## 2020-10-03 DIAGNOSIS — K766 Portal hypertension: Secondary | ICD-10-CM

## 2020-10-03 DIAGNOSIS — I8500 Esophageal varices without bleeding: Secondary | ICD-10-CM | POA: Diagnosis not present

## 2020-10-03 DIAGNOSIS — K3189 Other diseases of stomach and duodenum: Secondary | ICD-10-CM | POA: Diagnosis not present

## 2020-10-03 DIAGNOSIS — D123 Benign neoplasm of transverse colon: Secondary | ICD-10-CM

## 2020-10-03 DIAGNOSIS — D122 Benign neoplasm of ascending colon: Secondary | ICD-10-CM

## 2020-10-03 DIAGNOSIS — Z1211 Encounter for screening for malignant neoplasm of colon: Secondary | ICD-10-CM

## 2020-10-03 DIAGNOSIS — K2970 Gastritis, unspecified, without bleeding: Secondary | ICD-10-CM | POA: Diagnosis not present

## 2020-10-03 DIAGNOSIS — K2900 Acute gastritis without bleeding: Secondary | ICD-10-CM

## 2020-10-03 MED ORDER — OMEPRAZOLE 20 MG PO CPDR: 20.0000 mg | DELAYED_RELEASE_CAPSULE | Freq: Every day | ORAL | 11 refills | Status: AC

## 2020-10-03 MED ORDER — SODIUM CHLORIDE 0.9 % IV SOLN: 500.0000 mL | Freq: Once | INTRAVENOUS | Status: DC

## 2020-10-03 NOTE — Op Note (Signed)
[facility] Patient Name: David Bartlett Procedure Date: 10/03/2020 12:56 PM MRN: [id number] Endoscopist: Justice Britain , MD Age: 49 Referring MD:  Date of Birth: 01/11/1971 Gender: Male Account #: [id number] Procedure:                Upper GI endoscopy Indications:              Duodenal ulcer, Follow-up of duodenal ulcer, Portal                            hypertensive gastropathy, Follow-up of portal                            hypertensive gastropathy, Portal hypertension rule                            out esophageal varices, Exclusion of gastric varices Medicines:                Monitored Anesthesia Care Procedure:                Pre-Anesthesia Assessment:                           - Prior to the procedure, a History and Physical                            was performed, and patient medications and                            allergies were reviewed. The patient's tolerance of                            previous anesthesia was also reviewed. The risks                            and benefits of the procedure and the sedation                            options and risks were discussed with the patient.                            All questions were answered, and informed consent                            was obtained. Prior Anticoagulants: The patient has                            taken no previous anticoagulant or antiplatelet                            agents. ASA Grade Assessment: III - A patient with                            severe systemic disease. After reviewing the risks  and benefits, the patient was deemed in                            satisfactory condition to undergo the procedure.                           After obtaining informed consent, the endoscope was                            passed under direct vision. Throughout the                            procedure, the patient's blood pressure, pulse, and                             oxygen saturations were monitored continuously. The                            Endoscope was introduced through the mouth, and                            advanced to the second part of duodenum. The upper                            GI endoscopy was accomplished without difficulty.                            The patient tolerated the procedure. Scope In: Scope Out: Findings:                 Grade I varices were found in the distal esophagus.                           No other gross lesions were noted in the entire                            esophagus.                           The Z-line was regular and was found 43 cm from the                            incisors.                           Severe portal hypertensive gastropathy was found in                            the entire examined stomach.                           A few dispersed diminutive erosions with no                            bleeding and no stigmata of recent bleeding were  found in the gastric body and in the gastric antrum.                           No other gross lesions were noted in the entire                            examined stomach. Biopsies were taken with a cold                            forceps for histology and Helicobacter pylori                            testing.                           No gross lesions were noted in the duodenal bulb,                            in the first portion of the duodenum and in the                            second portion of the duodenum. Biopsies were taken                            with a cold forceps for histology. Complications:            No immediate complications. Estimated Blood Loss:     Estimated blood loss was minimal. Impression:               - Grade I esophageal varices distally. No other                            gross lesions in esophagus. Z-line regular, 43 cm                            from the incisors.                            - Portal hypertensive gastropathy. Erosive                            gastropathy with no bleeding and no stigmata of                            recent bleeding. No other gross lesions in the                            stomach. Biopsied for H. pylori.                           - No gross lesions in the duodenal bulb, in the                            first portion of the duodenum and in  the second                            portion of the duodenum. Biopsied. Recommendation:           - Proceed to scheduled colonoscopy.                           - Observe patient's clinical course.                           - Restart PPI - Omeprazole 20 mg daily and maintain                            for now.                           - Recommend continued attempt at alcohol cessation.                            Evidence of portal hypertension still exists in                            setting of the portal gastropathy as well as the                            the grade 1 esophageal varices. Need to consider                            potential initiation of a non-selective                            beta-blocker in the future to decrease issues/risks                            of further portal hypertension.                           - The findings and recommendations were discussed                            with the patient.                           - The findings and recommendations were discussed                            with the patient's family. Justice Britain, MD 10/03/2020 1:46:38 PM

## 2020-10-03 NOTE — Patient Instructions (Signed)
Handouts provided on gastritis, polyps, diverticulosis, hemorrhoids and high-fiber diet.   Restart PPI-Omeprazole 20mg  daily and maintain for now.   Recommend continue attempt at alcohol cessation. Evidence of portal hypertension still exists in setting of the portal gastropathy as well as in the grade 1 esophageal varices.   Recommend High-fiber diet to keep stools soft and easy to pass to prevent complications from diverticulosis and hemorrhoids.   YOU HAD AN ENDOSCOPIC PROCEDURE TODAY AT [facility]:   Refer to the procedure report that was given to you for any specific questions about what was found during the examination.  If the procedure report does not answer your questions, please call your gastroenterologist to clarify.  If you requested that your care partner not be given the details of your procedure findings, then the procedure report has been included in a sealed envelope for you to review at your convenience later.  YOU SHOULD EXPECT: Some feelings of bloating in the abdomen. Passage of more gas than usual.  Walking can help get rid of the air that was put into your GI tract during the procedure and reduce the bloating. If you had a lower endoscopy (such as a colonoscopy or flexible sigmoidoscopy) you may notice spotting of blood in your stool or on the toilet paper. If you underwent a bowel prep for your procedure, you may not have a normal bowel movement for a few days.  Please Note:  You might notice some irritation and congestion in your nose or some drainage.  This is from the oxygen used during your procedure.  There is no need for concern and it should clear up in a day or so.  SYMPTOMS TO REPORT IMMEDIATELY:   Following lower endoscopy (colonoscopy or flexible sigmoidoscopy):  Excessive amounts of blood in the stool  Significant tenderness or worsening of abdominal pains  Swelling of the abdomen that is new, acute  Fever of 100F or higher   Following  upper endoscopy (EGD)  Vomiting of blood or coffee ground material  New chest pain or pain under the shoulder blades  Painful or persistently difficult swallowing  New shortness of breath  Fever of 100F or higher  Black, tarry-looking stools  For urgent or emergent issues, a gastroenterologist can be reached at any hour by calling [phone]. Do not use MyChart messaging for urgent concerns.    DIET:  We do recommend a small meal at first, but then you may proceed to your regular diet.  Drink plenty of fluids but you should avoid alcoholic beverages for 24 hours.  ACTIVITY:  You should plan to take it easy for the rest of today and you should NOT DRIVE or use heavy machinery until tomorrow (because of the sedation medicines used during the test).    FOLLOW UP: Our staff will call the number listed on your records 48-72 hours following your procedure to check on you and address any questions or concerns that you may have regarding the information given to you following your procedure. If we do not reach you, we will leave a message.  We will attempt to reach you two times.  During this call, we will ask if you have developed any symptoms of COVID 19. If you develop any symptoms (ie: fever, flu-like symptoms, shortness of breath, cough etc.) before then, please call [phone].  If you test positive for Covid 19 in the 2 weeks post procedure, please call and report this information to [place].  If any biopsies were taken you will be contacted by phone or by letter within the next 1-3 weeks.  Please call us at [phone] if you have not heard about the biopsies in 3 weeks.    SIGNATURES/CONFIDENTIALITY: You and/or your care partner have signed paperwork which will be entered into your electronic medical record.  These signatures attest to the fact that that the information above on your After Visit Summary has been reviewed and is understood.  Full responsibility of the confidentiality  of this discharge information lies with you and/or your care-partner.

## 2020-10-03 NOTE — Progress Notes (Signed)
To PACU, VSS. Report to [email or web address] 

## 2020-10-03 NOTE — Progress Notes (Signed)
Pt's states no medical or surgical changes since previsit or office visit. 

## 2020-10-03 NOTE — Op Note (Signed)
[place] Patient Name: David Bartlett Procedure Date: 10/03/2020 12:55 PM MRN: [id number] Endoscopist: Justice Britain , MD Age: 49 Referring MD:  Date of Birth: Aug 18, 1971 Gender: Male Account #: [id number] Procedure:                Colonoscopy Indications:              Screening for colorectal malignant neoplasm Medicines:                Monitored Anesthesia Care Procedure:                Pre-Anesthesia Assessment:                           - Prior to the procedure, a History and Physical                            was performed, and patient medications and                            allergies were reviewed. The patient's tolerance of                            previous anesthesia was also reviewed. The risks                            and benefits of the procedure and the sedation                            options and risks were discussed with the patient.                            All questions were answered, and informed consent                            was obtained. Prior Anticoagulants: The patient has                            taken no previous anticoagulant or antiplatelet                            agents. ASA Grade Assessment: III - A patient with                            severe systemic disease. After reviewing the risks                            and benefits, the patient was deemed in                            satisfactory condition to undergo the procedure.                           After obtaining informed consent, the colonoscope  was passed under direct vision. Throughout the                            procedure, the patient's blood pressure, pulse, and                            oxygen saturations were monitored continuously. The                            Colonoscope was introduced through the anus and                            advanced to the the cecum, identified by                            appendiceal orifice  and ileocecal valve. The                            colonoscopy was performed without difficulty. The                            patient tolerated the procedure. The quality of the                            bowel preparation was adequate. The ileocecal                            valve, appendiceal orifice, and rectum were                            photographed. Scope In: 1:15:48 PM Scope Out: 1:38:23 PM Scope Withdrawal Time: 0 hours 21 minutes 1 second  Total Procedure Duration: 0 hours 22 minutes 35 seconds  Findings:                 The digital rectal exam findings include                            hemorrhoids. Pertinent negatives include no                            palpable rectal lesions.                           The colon (entire examined portion) revealed                            moderately excessive looping.                           A moderate amount of liquid semi-liquid stool was                            found in the entire colon, interfering with  visualization. Lavage of the area was performed                            using copious amounts, resulting in clearance with                            adequate visualization.                           Six sessile polyps were found in the rectum (1),                            descending colon (1), transverse colon (2) and                            ascending colon (2). The polyps were 2 to 10 mm in                            size. These polyps were removed with a cold snare.                            Resection and retrieval were complete.                           Multiple small-mouthed diverticula were found in                            the recto-sigmoid colon and sigmoid colon.                           Normal mucosa was found in the entire colon                            otherwise.                           Anal papilla(e) were hypertrophied.                           Non-bleeding  non-thrombosed external and internal                            hemorrhoids were found during retroflexion, during                            perianal exam and during digital exam. The                            hemorrhoids were Grade II (internal hemorrhoids                            that prolapse but reduce spontaneously). Complications:            No immediate complications. Estimated Blood Loss:     Estimated blood loss was minimal. Impression:               -  Hemorrhoids found on digital rectal exam.                           - There was significant looping of the colon.                           - Stool in the entire examined colon.                           - Six 2 to 10 mm polyps in the rectum, in the                            descending colon, in the transverse colon and in                            the ascending colon, removed with a cold snare.                            Resected and retrieved.                           - Diverticulosis in the recto-sigmoid colon and in                            the sigmoid colon.                           - Normal mucosa in the entire examined colon.                           - Anal papilla(e) were hypertrophied.                           - Non-bleeding non-thrombosed external and internal                            hemorrhoids. Recommendation:           - The patient will be observed post-procedure,                            until all discharge criteria are met.                           - Discharge patient to home.                           - Patient has a contact number available for                            emergencies. The signs and symptoms of potential                            delayed complications were discussed with the  patient. Return to normal activities tomorrow.                            Written discharge instructions were provided to the                            patient.                            - High fiber diet.                           - Await pathology results.                           - Repeat colonoscopy in 3 years for surveillance                            based on final pathology.                           - The findings and recommendations were discussed                            with the patient. Justice Britain, MD 10/03/2020 1:50:39 PM

## 2020-10-03 NOTE — Progress Notes (Signed)
Called to room to assist during endoscopic procedure.  Patient ID and intended procedure confirmed with present staff. Received instructions for my participation in the procedure from the performing physician.  

## 2020-10-05 ENCOUNTER — Telehealth: Payer: Self-pay

## 2020-10-05 NOTE — Telephone Encounter (Signed)
  Follow up Call-  Call back number 10/03/2020  Post procedure Call Back phone  # [phone]  Permission to leave phone message Yes  Some recent data might be hidden     Patient questions:  Do you have a fever, pain , or abdominal swelling? No. Pain Score  0 *  Have you tolerated food without any problems? Yes.    Have you been able to return to your normal activities? Yes.    Do you have any questions about your discharge instructions: Diet   No. Medications  No. Follow up visit  No.  Do you have questions or concerns about your Care? No.  Actions: * If pain score is 4 or above: No action needed, pain <4.  1. Have you developed a fever since your procedure? no  2.   Have you had an respiratory symptoms (SOB or cough) since your procedure? no  3.   Have you tested positive for COVID 19 since your procedure no  4.   Have you had any family members/close contacts diagnosed with the COVID 19 since your procedure?  no   If yes to any of these questions please route to Joylene John, RN and Joella Prince, RN

## 2020-10-18 ENCOUNTER — Encounter: Payer: Self-pay | Admitting: Gastroenterology

## 2021-02-15 ENCOUNTER — Other Ambulatory Visit: Payer: Self-pay | Admitting: Physician Assistant

## 2021-02-15 DIAGNOSIS — R7989 Other specified abnormal findings of blood chemistry: Secondary | ICD-10-CM

## 2021-06-30 ENCOUNTER — Other Ambulatory Visit: Payer: Self-pay | Admitting: Family Medicine

## 2021-06-30 DIAGNOSIS — R062 Wheezing: Secondary | ICD-10-CM

## 2021-10-23 ENCOUNTER — Emergency Department (HOSPITAL_COMMUNITY): Payer: No Typology Code available for payment source

## 2021-10-23 ENCOUNTER — Other Ambulatory Visit: Payer: Self-pay

## 2021-10-23 ENCOUNTER — Emergency Department (HOSPITAL_COMMUNITY)
Admission: EM | Admit: 2021-10-23 | Discharge: 2021-10-23 | Disposition: A | Discharge status: Home / Self Care | Payer: No Typology Code available for payment source | Attending: Emergency Medicine | Admitting: Emergency Medicine

## 2021-10-23 ENCOUNTER — Encounter (HOSPITAL_COMMUNITY): Payer: Self-pay | Admitting: Emergency Medicine

## 2021-10-23 DIAGNOSIS — F17210 Nicotine dependence, cigarettes, uncomplicated: Secondary | ICD-10-CM | POA: Insufficient documentation

## 2021-10-23 DIAGNOSIS — R0789 Other chest pain: Secondary | ICD-10-CM | POA: Diagnosis present

## 2021-10-23 DIAGNOSIS — Z79899 Other long term (current) drug therapy: Secondary | ICD-10-CM | POA: Insufficient documentation

## 2021-10-23 DIAGNOSIS — I10 Essential (primary) hypertension: Secondary | ICD-10-CM | POA: Diagnosis not present

## 2021-10-23 DIAGNOSIS — J45909 Unspecified asthma, uncomplicated: Secondary | ICD-10-CM | POA: Diagnosis not present

## 2021-10-23 DIAGNOSIS — Z96642 Presence of left artificial hip joint: Secondary | ICD-10-CM | POA: Diagnosis not present

## 2021-10-23 DIAGNOSIS — K921 Melena: Secondary | ICD-10-CM | POA: Insufficient documentation

## 2021-10-23 DIAGNOSIS — E871 Hypo-osmolality and hyponatremia: Secondary | ICD-10-CM | POA: Diagnosis not present

## 2021-10-23 DIAGNOSIS — E119 Type 2 diabetes mellitus without complications: Secondary | ICD-10-CM | POA: Diagnosis not present

## 2021-10-23 DIAGNOSIS — R079 Chest pain, unspecified: Secondary | ICD-10-CM

## 2021-10-23 LAB — TROPONIN I (HIGH SENSITIVITY)
Troponin I (High Sensitivity): 19 ng/L — ABNORMAL HIGH (ref <18)
Troponin I (High Sensitivity): 20 ng/L — ABNORMAL HIGH (ref <18)

## 2021-10-23 LAB — CBC
HCT: 45 % (ref 39.0–52.0)
Hemoglobin: 15.5 g/dL (ref 13.0–17.0)
MCH: 32 pg (ref 26.0–34.0)
MCHC: 34.4 g/dL (ref 30.0–36.0)
MCV: 93 fL (ref 80.0–100.0)
Platelets: 87 10*3/uL — ABNORMAL LOW (ref 150–400)
RBC: 4.84 MIL/uL (ref 4.22–5.81)
RDW: 12.7 % (ref 11.5–15.5)
WBC: 7.6 10*3/uL (ref 4.0–10.5)
nRBC: 0 % (ref 0.0–0.2)

## 2021-10-23 LAB — COMPREHENSIVE METABOLIC PANEL
ALT: 66 U/L — ABNORMAL HIGH (ref 0–44)
AST: 105 U/L — ABNORMAL HIGH (ref 15–41)
Albumin: 4 g/dL (ref 3.5–5.0)
Alkaline Phosphatase: 103 U/L (ref 38–126)
Anion gap: 12 (ref 5–15)
BUN: 8 mg/dL (ref 6–20)
CO2: 23 mmol/L (ref 22–32)
Calcium: 9.2 mg/dL (ref 8.9–10.3)
Chloride: 96 mmol/L — ABNORMAL LOW (ref 98–111)
Creatinine, Ser: 0.66 mg/dL (ref 0.61–1.24)
GFR, Estimated: >60 mL/min (ref >60)
Glucose, Bld: 101 mg/dL — ABNORMAL HIGH (ref 70–99)
Potassium: 3.6 mmol/L (ref 3.5–5.1)
Sodium: 131 mmol/L — ABNORMAL LOW (ref 135–145)
Total Bilirubin: 1.7 mg/dL — ABNORMAL HIGH (ref 0.3–1.2)
Total Protein: 7.7 g/dL (ref 6.5–8.1)

## 2021-10-23 LAB — PROTIME-INR
INR: 1.2 (ref 0.8–1.2)
Prothrombin Time: 15.1 seconds (ref 11.4–15.2)

## 2021-10-23 LAB — POC OCCULT BLOOD, ED: Fecal Occult Bld: POSITIVE — AB

## 2021-10-23 MED ORDER — IOHEXOL 350 MG/ML SOLN
65.0000 mL | Freq: Once | INTRAVENOUS | Status: AC | PRN
  Administered 2021-10-23: 65 mL via INTRAVENOUS

## 2021-10-23 MED ORDER — SODIUM CHLORIDE 0.9 % IV BOLUS
1000.0000 mL | Freq: Once | INTRAVENOUS | Status: AC
  Administered 2021-10-23: 1000 mL via INTRAVENOUS

## 2021-10-23 MED ORDER — HYDROCORTISONE 1 % EX CREA: TOPICAL_CREAM | CUTANEOUS | 0 refills | Status: AC

## 2021-10-23 NOTE — ED Provider Notes (Signed)
Emergency Medicine Provider Triage Evaluation Note  David Bartlett , a 50 y.o. male  was evaluated in triage.  Pt complains of chest pain.  Patient states for the past 3 days he has had intermittent sharp left-sided chest pain.  He states that he is also had intermittent right arm pain and numbness.  He denies associated shortness of breath.  He has been having some nausea and dry heaving, feel like he has had hot flashes.  However these do not correlate with the chest pain.  He denies fevers or recent illnesses.  Denies trauma to his chest.  Review of Systems  Positive: Chest pain Negative: Shortness of breath  Physical Exam  There were no vitals taken for this visit. Gen:   Awake, no distress   Resp:  Normal effort, right-sided rales MSK:   Moves extremities without difficulty  Other:  S1/S2 without murmurs.  Pulses 2+ bilaterally.  No lower extremity edema.  Medical Decision Making  Medically screening exam initiated at 4:53 PM.  Appropriate orders placed.  Josph Macho was informed that the remainder of the evaluation will be completed by another provider, this initial triage assessment does not replace that evaluation, and the importance of remaining in the ED until their evaluation is complete.     Mickie Hillier, PA-C 10/23/21 1655    Godfrey Pick, MD 10/24/21 [phone]

## 2021-10-23 NOTE — ED Notes (Signed)
PT ambulating to bathroom with steady gait.

## 2021-10-23 NOTE — ED Provider Notes (Signed)
Care of patient assumed from PA [place] at 7 PM.  This patient presents for left-sided chest pain.  Initial troponin was 19.  Delta troponin and CTA chest are pending.  He also has a history of cirrhosis and does endorse some dark stools over the past 2 days.  DRE shows no gross melena but was Hemoccult positive.  Current platelets are 87,000, which is decreased from his baseline. Physical Exam  BP 122/68   Pulse 79   Temp 97.7 F (36.5 C) (Oral)   Resp 18   Ht 5\' 7"  (1.702 m)   Wt 95.3 kg   SpO2 96%   BMI 32.89 kg/m   Physical Exam Constitutional:      General: He is not in acute distress.    Appearance: He is well-developed. He is not ill-appearing, toxic-appearing or diaphoretic.  HENT:     Head: Normocephalic.  Eyes:     Extraocular Movements: Extraocular movements intact.  Cardiovascular:     Rate and Rhythm: Normal rate and regular rhythm.  Musculoskeletal:     Cervical back: Normal range of motion.  Skin:    General: Skin is warm and dry.  Neurological:     General: No focal deficit present.     Mental Status: He is alert and oriented to person, place, and time.     Cranial Nerves: No cranial nerve deficit.     Motor: No weakness.  Psychiatric:        Mood and Affect: Mood normal.        Behavior: Behavior normal.    ED Course/Procedures     Procedures  MDM  Patient's delta troponin is stable.  CTA of chest showed no evidence of PE.  On assessment, patient has complete resolution of his chest discomfort.  I did speak with him about his recent dark stools, and low platelets.  Patient reports that he gets endoscopy every 6 months.  His last scope was less than 6 months ago.  At that time, no lesions were seen in his stomach.  He did have colonic polyps.  He has an appointment with his GI doctor next week.  I did speak to gastroenterologist on-call regarding concern for possible GI bleed in the setting of low platelets.  Gastroenterologist recommended the following:  Given his normal hemoglobin, patient unlikely to have any clinically significant GI bleed at this time.  No platelet transfusion is indicated.  Patient is appropriate for discharge with GI follow-up, as scheduled next week.  This was relayed to the patient who is in favor of going home tonight.  He was encouraged to return to the ED if he does experience any new or worsening symptoms.  Currently, he does take omeprazole twice a day.  He was advised to continue this medication.  Patient and significant other expressed understanding.  He was discharged in stable condition.       Godfrey Pick, MD 10/24/21 [phone]

## 2021-10-23 NOTE — ED Triage Notes (Signed)
Pt complains of chest pain.  Patient states for the past 3 days he has had intermittent sharp left-sided chest pain.  He states that he is also had intermittent right arm pain and numbness.  He denies associated shortness of breath.  He has been having some nausea and dry heaving, feel like he has had hot flashes.  However these do not correlate with the chest pain.  He denies fevers or recent illnesses.  Denies trauma to his chest.

## 2021-10-23 NOTE — ED Provider Notes (Signed)
[facility] EMERGENCY DEPARTMENT Provider Note   CSN: [id number] Arrival date & time: 10/23/21  1633     History Chief Complaint  Patient presents with   Chest Pain    David Bartlett is a 50 y.o. male with a past medical history of hypertension, diabetes mellitus and liver cirrhosis presenting today due to on and off chest pain that began on Sunday.  Patient describes his pain as dull.  No history of ACS.  He reports that he follows with [facility] for his liver cirrhosis.  In the past he has had bleeding esophageal varices as well as ulcers secondary to [place] powder use.  Denies any emesis however does state that he is noticed occasional dark and tarry stools.  No bright red blood.  Denies any shortness of breath increased from his baseline.  Continues to smoke every day.  Is currently not experiencing chest discomfort however reports that he is feeling weak overall.   Past Medical History:  Diagnosis Date   Abnormal EKG    Asthmatic bronchitis    Diabetes mellitus without complication (HCC)    HTN (hypertension)    Left breast mass    resolved on its own    Smoker     Patient Active Problem List   Diagnosis Date Noted   Familial hypercholesterolemia 08/28/2020   Atherosclerosis of aorta ([place]) 08/08/2020   Diet-controlled diabetes mellitus ([place]) 08/11/2019   Avascular necrosis of hip, left ([place]) 08/07/2017   Avascular necrosis of bone of hip, left ([place]) 06/20/2017   Erectile dysfunction 12/29/2015   Essential hypertension 05/17/2011   Hyperlipidemia associated with type 2 diabetes mellitus ([place]) 05/17/2011   Tobacco abuse 05/17/2011    Past Surgical History:  Procedure Laterality Date   JOINT REPLACEMENT     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/07/2017   Procedure: LEFT TOTAL HIP ARTHROPLASTY ANTERIOR APPROACH;  Surgeon: Rod Can, MD;  Location: [facility] OR[facility];  Service: Orthopedics;  Laterality: Left;  Needs RNFA       Family History  Problem  Relation Age of Onset   Kidney disease Mother    Heart attack Mother 11       died   Cancer Father    Heart attack Father 28       died   Diabetes Sister    Diabetes Maternal Grandmother    Colon cancer Neg Hx    Esophageal cancer Neg Hx    Stomach cancer Neg Hx    Pancreatic cancer Neg Hx     Social History   Tobacco Use   Smoking status: Every Day    Packs/day: 1.00    Years: 30.00    Pack years: 30.00    Types: Cigarettes   Smokeless tobacco: Never  Vaping Use   Vaping Use: Former  Substance Use Topics   Alcohol use: Yes    Alcohol/week: 2.0 standard drinks    Types: 2 Cans of beer per week    Comment: daily   Drug use: No    Home Medications Prior to Admission medications   Medication Sig Start Date End Date Taking? Authorizing Provider  albuterol (VENTOLIN HFA) 108 (90 Base) MCG/ACT inhaler USE 2 PUFFS EVERY 6 HOURS AS NEEDED 02/08/20   Ronnie Doss M, DO  amLODipine (NORVASC) 10 MG tablet Take 1 tablet (10 mg total) by mouth daily. 08/11/19   Janora Norlander, DO  Cetirizine HCl 10 MG TBDP Take 1 tablet by mouth daily.    [provider]  cholecalciferol (VITAMIN D) 1000 units tablet Take 1,000 Units by mouth daily.    [provider]  lisinopril (ZESTRIL) 10 MG tablet TAKE ONE (1) TABLET EACH DAY 08/11/19   Ronnie Doss M, DO  omeprazole (PRILOSEC) 20 MG capsule Take 1 capsule (20 mg total) by mouth daily. 10/03/20   Mansouraty, Telford Nab., MD  triamterene-hydrochlorothiazide [facility]) 75-50 MG tablet Take 1 tablet by mouth every morning. 08/11/19   Ronnie Doss M, DO  umeclidinium-vilanterol (ANORO ELLIPTA) 62.5-25 MCG/INH AEPB Inhale 1 puff into the lungs daily. ((NEEDS TO BE SEEN BEFORE NEXT REFILL) 07/02/21   Ronnie Doss M, DO  vitamin C (ASCORBIC ACID) 250 MG tablet Take 250 mg by mouth daily.    [provider]    Allergies    Penicillins  Review of Systems   Review of Systems  Constitutional:  Negative  for chills and fever.  Respiratory:  Negative for shortness of breath.   Cardiovascular:  Positive for chest pain.  Gastrointestinal:  Positive for blood in stool. Negative for abdominal distention, abdominal pain, anal bleeding, nausea and vomiting.  Neurological:  Positive for weakness. Negative for dizziness.  All other systems reviewed and are negative.  Physical Exam Updated Vital Signs BP 122/68   Pulse 79   Temp 97.7 F (36.5 C) (Oral)   Resp 18   Ht 5\' 7"  (1.702 m)   Wt 95.3 kg   SpO2 96%   BMI 32.89 kg/m   Physical Exam Vitals and nursing note reviewed.  Constitutional:      General: He is not in acute distress.    Appearance: Normal appearance. He is not ill-appearing.  HENT:     Head: Normocephalic and atraumatic.  Eyes:     General: No scleral icterus.    Conjunctiva/sclera: Conjunctivae normal.  Cardiovascular:     Rate and Rhythm: Normal rate and regular rhythm.  Pulmonary:     Effort: Pulmonary effort is normal.     Breath sounds: Examination of the right-upper field reveals wheezing. Examination of the left-upper field reveals wheezing. Wheezing present. No rales.  Abdominal:     Palpations: There is no mass.     Tenderness: There is no abdominal tenderness. There is no guarding.     Comments: Distended with notable skin breakdown around patient's pannus  Genitourinary:    Rectum: Guaiac result positive.     Comments: Performed with EMT at bedside.  No sign of external or internal hemorrhoids.  Stool a light tan color.  No melena or obvious bleeding.  No fissures. Musculoskeletal:     Right lower leg: No edema.     Left lower leg: No edema.  Skin:    General: Skin is warm and dry.     Findings: No rash.  Neurological:     Mental Status: He is alert.  Psychiatric:        Mood and Affect: Mood normal.        Behavior: Behavior normal.    ED Results / Procedures / Treatments   Labs (all labs ordered are listed, but only abnormal results are  displayed) Labs Reviewed  CBC - Abnormal; Notable for the following components:      Result Value   Platelets 87 (*)    All other components within normal limits  POC OCCULT BLOOD, ED - Abnormal; Notable for the following components:   Fecal Occult Bld POSITIVE (*)    All other components within normal limits  COMPREHENSIVE METABOLIC  PANEL  TROPONIN I (HIGH SENSITIVITY)    EKG EKG Interpretation  Date/Time:  Tuesday October 23 2021 16:45:49 EST Ventricular Rate:  72 PR Interval:  164 QRS Duration: 100 QT Interval:  438 QTC Calculation: 479 R Axis:   81 Text Interpretation: Normal sinus rhythm Normal ECG Confirmed by Godfrey Pick [phone]) on 10/23/2021 5:27:22 PM  Radiology DG Chest Port 1 View  Result Date: 10/23/2021 CLINICAL DATA:  Chest pain EXAM: PORTABLE CHEST 1 VIEW COMPARISON:  02/01/2021 FINDINGS: The heart size and mediastinal contours are within normal limits. Aortic atherosclerosis. Both lungs are clear. The visualized skeletal structures are unremarkable. IMPRESSION: No active disease. Electronically Signed   By: Donavan Foil M.D.   On: 10/23/2021 18:17    Procedures Procedures   Medications Ordered in ED Medications  sodium chloride 0.9 % bolus 1,000 mL (1,000 mLs Intravenous New Bag/Given 10/23/21 1840)    ED Course  I have reviewed the triage vital signs and the nursing notes.  Pertinent labs & imaging results that were available during my care of the patient were reviewed by me and considered in my medical decision making (see chart for details).    MDM Rules/Calculators/A&P The emergent differential diagnosis of chest pain includes: Acute coronary syndrome, pericarditis, aortic dissection, pulmonary embolism, tension pneumothorax, and esophageal rupture.  All of these were considered throughout the evaluation of this patient.  His EKG was in normal sinus with a regular rate.  Chest x-ray negative.  First trop mildly elevated to 19.  We will continue to  trend.  CBC revealed a hyponatremia to 131.  We will give the patient a bolus of NS. Noted to have elevated LFTs however do appear to be down from his numbers with [facility] in August. Patient did have a positive Hemoccult.  Hemoglobin within normal limits but platelets noted to be 87.this is down from 141 in August.  INR ordered at this time.  I discussed the patient with Dr. Doren Custard who suggested that we order a CT angio to rule out pulmonary embolism.  This has been ordered.  All of these results and overall plan of care have been shared with the patient and his wife.  They voiced understanding and no further questions at this time.  Patient to be signed out to Dr. Doren Custard at shift change.  Please see his note for continuation of care and ultimate disposition.  Final Clinical Impression(s) / ED Diagnoses Final diagnoses:  None    Rx / DC Orders Patient passed off to MD Dixon at discharge.  Please see his note for further evaluation and ultimate disposition.    Darliss Ridgel 10/23/21 1901    Godfrey Pick, MD 10/24/21 [phone]

## 2021-10-23 NOTE — Discharge Instructions (Addendum)
Keep your follow-up appointment with your GI doctor next week.  If you develop any worsening of symptoms please return to the emergency department.

## 2022-10-22 ENCOUNTER — Encounter (HOSPITAL_COMMUNITY): Payer: Self-pay | Admitting: *Deleted

## 2022-10-22 ENCOUNTER — Emergency Department (HOSPITAL_COMMUNITY)
Admission: EM | Admit: 2022-10-22 | Discharge: 2022-10-22 | Discharge status: Against Medical Advice | Payer: No Typology Code available for payment source

## 2022-10-22 ENCOUNTER — Ambulatory Visit (HOSPITAL_COMMUNITY)
Admission: EM | Admit: 2022-10-22 | Discharge: 2022-10-22 | Disposition: A | Discharge status: Home / Self Care | Payer: No Typology Code available for payment source

## 2022-10-22 DIAGNOSIS — L989 Disorder of the skin and subcutaneous tissue, unspecified: Secondary | ICD-10-CM

## 2022-10-22 MED ORDER — CEPHALEXIN 500 MG PO CAPS: 500.0000 mg | ORAL_CAPSULE | Freq: Two times a day (BID) | ORAL | 0 refills | Status: AC

## 2022-10-22 NOTE — ED Triage Notes (Signed)
Pt cut his right lower arm a few weeks and it started out a small cut, and not it is larger and is painful. His wife has been spraying ti with alcohol and peroxide and neosporin on the wound,.

## 2022-10-22 NOTE — Discharge Instructions (Addendum)
Lesion appears to be a overgrowth of skin that has occurred as it has tried to heal your injury  Today we will provide bacterial coverage to remove any germs from the affected area, begin Keflex every morning and every evening for 5 days  You may cleanse daily with diluted soapy water, pat dry and you may cover with a padded covering such as a calluses which can be found at any of the local pharmacies in the foot care section  You may have schedule an appointment with general surgery for evaluation of removal, information is on front page

## 2022-10-22 NOTE — ED Provider Notes (Signed)
[name]    CSN: [id number] Arrival date & time: 10/22/22  1430      History   Chief Complaint Chief Complaint  Patient presents with   Wound Check    HPI [facility] is a 51 y.o. male.   Patient presents for evaluation of lesion to the right forearm that has occurred over the last 3 weeks.  Initially started as a small abrasions from unknown injury.  Pain is present when site is touched or bumped.  Lesion has increased in size.  Bleeding has occurred at times .  Denies puslike drainage, fever, chills.  Has cleaned with alcohol, peroxide and Neosporin.  Past Medical History:  Diagnosis Date   Abnormal EKG    Asthmatic bronchitis    Diabetes mellitus without complication (HCC)    HTN (hypertension)    Left breast mass    resolved on its own    Smoker     Patient Active Problem List   Diagnosis Date Noted   Familial hypercholesterolemia 08/28/2020   Atherosclerosis of aorta ([place]) 08/08/2020   Diet-controlled diabetes mellitus ([place]) 08/11/2019   Avascular necrosis of hip, left ([place]) 08/07/2017   Avascular necrosis of bone of hip, left ([place]) 06/20/2017   Erectile dysfunction 12/29/2015   Essential hypertension 05/17/2011   Hyperlipidemia associated with type 2 diabetes mellitus ([place]) 05/17/2011   Tobacco abuse 05/17/2011    Past Surgical History:  Procedure Laterality Date   JOINT REPLACEMENT     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/07/2017   Procedure: LEFT TOTAL HIP ARTHROPLASTY ANTERIOR APPROACH;  Surgeon: Rod Can, MD;  Location: [facility];  Service: Orthopedics;  Laterality: Left;  Needs RNFA       Home Medications    Prior to Admission medications   Medication Sig Start Date End Date Taking? Authorizing Provider  albuterol (VENTOLIN HFA) 108 (90 Base) MCG/ACT inhaler USE 2 PUFFS EVERY 6 HOURS AS NEEDED 02/08/20  Yes Gottschalk, Ashly M, DO  amLODipine (NORVASC) 10 MG tablet Take 1 tablet (10 mg total) by mouth daily. 08/11/19  Yes  Ronnie Doss M, DO  aspirin 81 MG chewable tablet 1 tablet every day by oral route.   Yes [provider]  Cetirizine HCl 10 MG TBDP Take 1 tablet by mouth daily.   Yes [provider]  cholecalciferol (VITAMIN D) 1000 units tablet Take 1,000 Units by mouth daily.   Yes [provider]  hydrocortisone cream 1 % Apply to affected area 2 times daily 10/23/21  Yes Redwine, Madison A, PA-C  lisinopril (ZESTRIL) 10 MG tablet TAKE ONE (1) TABLET EACH DAY 08/11/19  Yes Gottschalk, Ashly M, DO  pantoprazole (PROTONIX) 40 MG tablet TAKE ONE TABLET BY MOUTH TWICE A DAY BEFORE MEALS (TAKE ON AN EMPTY STOMACH 30 MINUTES PRIOR TO A MEAL) 07/30/20  Yes [provider]  triamterene-hydrochlorothiazide (MAXZIDE) 75-50 MG tablet Take 1 tablet by mouth every morning. 08/11/19  Yes Gottschalk, Ashly M, DO  umeclidinium-vilanterol (ANORO ELLIPTA) 62.5-25 MCG/INH AEPB Inhale 1 puff into the lungs daily. ((NEEDS TO BE SEEN BEFORE NEXT REFILL) 07/02/21  Yes Ronnie Doss M, DO  vitamin C (ASCORBIC ACID) 250 MG tablet Take 250 mg by mouth daily.   Yes [provider]  omeprazole (PRILOSEC) 20 MG capsule Take 1 capsule (20 mg total) by mouth daily. 10/03/20   Mansouraty, Telford Nab., MD    Family History Family History  Problem Relation Age of Onset   Kidney disease Mother  Heart attack Mother 96       died   Cancer Father    Heart attack Father 63       died   Diabetes Sister    Diabetes Maternal Grandmother    Colon cancer Neg Hx    Esophageal cancer Neg Hx    Stomach cancer Neg Hx    Pancreatic cancer Neg Hx     Social History Social History   Tobacco Use   Smoking status: Every Day    Packs/day: 1.00    Years: 30.00    Total pack years: 30.00    Types: Cigarettes   Smokeless tobacco: Never  Vaping Use   Vaping Use: Former  Substance Use Topics   Alcohol use: Yes    Alcohol/week: 2.0 standard drinks of alcohol    Types: 2 Cans of beer per week     Comment: daily   Drug use: No     Allergies   Penicillins   Review of Systems Review of Systems   Physical Exam Triage Vital Signs ED Triage Vitals  Enc Vitals Group     BP 10/22/22 1608 137/74     Pulse Rate 10/22/22 1608 84     Resp 10/22/22 1608 18     Temp 10/22/22 1608 99 F (37.2 C)     Temp Source 10/22/22 1608 Oral     SpO2 10/22/22 1608 97 %     Weight --      Height --      Head Circumference --      Peak Flow --      Pain Score 10/22/22 1606 9     Pain Loc --      Pain Edu? --      Excl. in [place]? --    No data found.  Updated Vital Signs BP 137/74 (BP Location: Left Arm)   Pulse 84   Temp 99 F (37.2 C) (Oral)   Resp 18   SpO2 97%   Visual Acuity Right Eye Distance:   Left Eye Distance:   Bilateral Distance:    Right Eye Near:   Left Eye Near:    Bilateral Near:     Physical Exam Constitutional:      Appearance: Normal appearance.  Eyes:     Extraocular Movements: Extraocular movements intact.  Pulmonary:     Effort: Pulmonary effort is normal.  Skin:    Comments: Deferred to follow below    Neurological:     Mental Status: He is alert and oriented to person, place, and time.      UC Treatments / Results  Labs (all labs ordered are listed, but only abnormal results are displayed) Labs Reviewed - No data to display  EKG   Radiology No results found.  Procedures Procedures (including critical care time)  Medications Ordered in UC Medications - No data to display  Initial Impression / Assessment and Plan / UC Course  I have reviewed the triage vital signs and the nursing notes.  Pertinent labs & imaging results that were available during my care of the patient were reviewed by me and considered in my medical decision making (see chart for details).  Skin Lesion  Site is erythematous and tender, firm and fluctuant, does not appear to be an abscess or cyst, most consistent with tissue overgrowth, discussed with  patie, bacterial coverage provided with Keflex, recommended cleansing with diluted soapy water, pat dry, may use padded dressing such as callus protector to  prevent injury when area is blocked, given information to general surgery for evaluation for removal Final Clinical Impressions(s) / UC Diagnoses   Final diagnoses:  None   Discharge Instructions   None    ED Prescriptions   None    PDMP not reviewed this encounter.   Hans [place], [place] 10/22/22 [phone]

## 2022-10-23 ENCOUNTER — Telehealth: Payer: Self-pay

## 2022-10-23 NOTE — Telephone Encounter (Signed)
Transition Care Management Follow-up Telephone Call Date of discharge and from where: 10/22/20[facility]  How have you been since you were released from the hospital? Patient states that he is doing okay - needs appt fo Gen Surgery was told he needs referral  Any questions or concerns? Yes - are we able to place referral for patient   Items Reviewed: Did the pt receive and understand the discharge instructions provided? Yes  Medications obtained and verified? Yes  Other? Yes  Any new allergies since your discharge? No  Dietary orders reviewed? Yes Do you have support at home? Yes   Home Care and Equipment/Supplies: Were home health services ordered? not applicable If so, what is the name of the agency? na  Has the agency set up a time to come to the patient's home? not applicable Were any new equipment or medical supplies ordered?  No What is the name of the medical supply agency? na Were you able to get the supplies/equipment? not applicable Do you have any questions related to the use of the equipment or supplies? No  Functional Questionnaire: (I = Independent and D = Dependent) ADLs: I  Bathing/Dressing- I  Meal Prep- I  Eating- I  Maintaining continence- I  Transferring/Ambulation- I  Managing Meds- I  Follow up appointments reviewed:  PCP Hospital f/u appt confirmed? No patient declines at this time  [facility] f/u appt confirmed? No  - per patient was told that he needs a referral [facility]  Are transportation arrangements needed? No  If their condition worsens, is the pt aware to call PCP or go to the Emergency Dept.? Yes Was the patient provided with contact information for the PCP's office or ED? Yes Was to pt encouraged to call back with questions or concerns? Yes

## 2023-06-18 ENCOUNTER — Encounter: Payer: Self-pay | Admitting: Internal Medicine

## 2023-06-18 ENCOUNTER — Ambulatory Visit: Payer: No Typology Code available for payment source | Attending: Internal Medicine | Admitting: Internal Medicine

## 2023-06-18 NOTE — Progress Notes (Deleted)
  Cardiology Office Note:  .    Date:  06/18/2023  ID:  [facility], DOB August 02, 1971, MRN [id number] PCP: [facility] Providers Cardiologist:  David Constant, MD { Click to update primary MD,subspecialty MD or APP then REFRESH:1}    CC: Return to [facility]  History of Present Illness: [facility]    David [facility] is a 52 yBartletto. male with a hx of Cirrhosis and Varices with prior GI bleed  Relevant histories: .  Social: [facility], has grandchildren, Tobacco and alcohol abuse  2021: Coronary artery calcifications noted ROS: As per HPI.   Studies Reviewed: [facility]        Physical Exam:    VS:  There were no vitals taken for this visit.   Wt Readings from Last 3 Encounters:  10/23/21 210 lb (95Bartlett3 kg)  08/28/20 216 lb (98 kg)  08/16/20 221 lb (100Bartlett2 kg)    Gen: *** distress, *** obese/well nourished/malnourished   Neck: No JVD, *** carotid bruit Ears: *** Frank Sign Cardiac: No Rubs or Gallops, *** Murmur, ***cardia, *** radial pulses Respiratory: Clear to auscultation bilaterally, *** effort, ***  respiratory rate GI: Soft, nontender, non-distended *** MS: No *** edema; *** moves all extremities Integument: Skin feels *** Neuro:  At time of evaluation, alert and oriented to person/place/time/situation *** Psych: Normal affect, patient feels ***   ASSESSMENT AND PLAN: .    CAC Aortic atherosclerosis  HTN with DM  Tobacco abuse   Alcohol abuse Hx of GI bleed, Varices, and Cirrhosis    David Lam, MD FASE [place] [facility] Cardiologist [facility]  [address] [place], [place] [address] [phone]  7:58 AM

## 2023-06-25 ENCOUNTER — Telehealth: Payer: Self-pay | Admitting: Internal Medicine

## 2023-06-25 NOTE — Telephone Encounter (Signed)
Wife stated patient received a bill for a no-show appointment on 7/3.  Wife stated patient is seen at the [place] and wants to know reason visit on 7/3 was scheduled.

## 2023-06-25 NOTE — Telephone Encounter (Signed)
7/3 OV scheduled by Ann Held will send a message to f/u.

## 2023-07-30 NOTE — Telephone Encounter (Signed)
Pt had not been seen by Dr. Izora Ribas since 08/28/20.  An OV was scheduled for pt on 01/17/23 by scheduler.  Called pt to f/u concern.  Pt expresses he didn't know anything about a no show fee.  However, does report that does not recall scheduling an OV in Feb.

## 2023-11-03 ENCOUNTER — Encounter: Payer: Self-pay | Admitting: Gastroenterology
